# Patient Record
Sex: Female | Born: 1955
Health system: Southern US, Community
[De-identification: ages and names within clinical notes are randomized; demographics above are authoritative.]

## PROBLEM LIST (undated history)

## (undated) DIAGNOSIS — M1990 Unspecified osteoarthritis, unspecified site: Secondary | ICD-10-CM

## (undated) DIAGNOSIS — M24411 Recurrent dislocation, right shoulder: Secondary | ICD-10-CM

## (undated) DIAGNOSIS — M549 Dorsalgia, unspecified: Secondary | ICD-10-CM

## (undated) DIAGNOSIS — G8929 Other chronic pain: Secondary | ICD-10-CM

## (undated) DIAGNOSIS — J449 Chronic obstructive pulmonary disease, unspecified: Secondary | ICD-10-CM

## (undated) DIAGNOSIS — I10 Essential (primary) hypertension: Secondary | ICD-10-CM

## (undated) HISTORY — PX: FRACTURE SURGERY: SHX138

## (undated) HISTORY — PX: TUBAL LIGATION: SHX77

---

## 2002-02-14 ENCOUNTER — Emergency Department (HOSPITAL_COMMUNITY): Admission: EM | Admit: 2002-02-14 | Discharge: 2002-02-14 | Payer: Self-pay | Admitting: Emergency Medicine

## 2012-09-19 DIAGNOSIS — G250 Essential tremor: Secondary | ICD-10-CM | POA: Insufficient documentation

## 2017-04-12 ENCOUNTER — Encounter (HOSPITAL_COMMUNITY): Payer: Self-pay | Admitting: Emergency Medicine

## 2017-04-12 ENCOUNTER — Emergency Department (HOSPITAL_COMMUNITY): Payer: Medicare Other

## 2017-04-12 ENCOUNTER — Observation Stay (HOSPITAL_COMMUNITY)
Admission: EM | Admit: 2017-04-12 | Discharge: 2017-04-13 | Disposition: A | Discharge status: Home / Self Care | Payer: Medicare Other | Attending: Family Medicine | Admitting: Family Medicine

## 2017-04-12 ENCOUNTER — Observation Stay (HOSPITAL_COMMUNITY): Payer: Medicare Other

## 2017-04-12 DIAGNOSIS — F17210 Nicotine dependence, cigarettes, uncomplicated: Secondary | ICD-10-CM | POA: Insufficient documentation

## 2017-04-12 DIAGNOSIS — R945 Abnormal results of liver function studies: Secondary | ICD-10-CM

## 2017-04-12 DIAGNOSIS — A419 Sepsis, unspecified organism: Secondary | ICD-10-CM | POA: Diagnosis present

## 2017-04-12 DIAGNOSIS — R7989 Other specified abnormal findings of blood chemistry: Secondary | ICD-10-CM | POA: Diagnosis present

## 2017-04-12 DIAGNOSIS — J441 Chronic obstructive pulmonary disease with (acute) exacerbation: Secondary | ICD-10-CM | POA: Diagnosis not present

## 2017-04-12 DIAGNOSIS — W06XXXD Fall from bed, subsequent encounter: Secondary | ICD-10-CM | POA: Insufficient documentation

## 2017-04-12 DIAGNOSIS — J9601 Acute respiratory failure with hypoxia: Secondary | ICD-10-CM | POA: Insufficient documentation

## 2017-04-12 DIAGNOSIS — R4182 Altered mental status, unspecified: Secondary | ICD-10-CM | POA: Diagnosis present

## 2017-04-12 DIAGNOSIS — I700 Atherosclerosis of aorta: Secondary | ICD-10-CM

## 2017-04-12 DIAGNOSIS — S43004D Unspecified dislocation of right shoulder joint, subsequent encounter: Secondary | ICD-10-CM

## 2017-04-12 DIAGNOSIS — I10 Essential (primary) hypertension: Secondary | ICD-10-CM | POA: Diagnosis present

## 2017-04-12 DIAGNOSIS — Z23 Encounter for immunization: Secondary | ICD-10-CM | POA: Diagnosis not present

## 2017-04-12 DIAGNOSIS — Z5181 Encounter for therapeutic drug level monitoring: Secondary | ICD-10-CM | POA: Insufficient documentation

## 2017-04-12 DIAGNOSIS — S43015D Anterior dislocation of left humerus, subsequent encounter: Principal | ICD-10-CM | POA: Insufficient documentation

## 2017-04-12 DIAGNOSIS — J9621 Acute and chronic respiratory failure with hypoxia: Secondary | ICD-10-CM | POA: Diagnosis not present

## 2017-04-12 DIAGNOSIS — R739 Hyperglycemia, unspecified: Secondary | ICD-10-CM | POA: Diagnosis present

## 2017-04-12 DIAGNOSIS — M24411 Recurrent dislocation, right shoulder: Secondary | ICD-10-CM | POA: Diagnosis present

## 2017-04-12 DIAGNOSIS — E66813 Obesity, class 3: Secondary | ICD-10-CM

## 2017-04-12 HISTORY — DX: Unspecified osteoarthritis, unspecified site: M19.90

## 2017-04-12 HISTORY — DX: Recurrent dislocation, right shoulder: M24.411

## 2017-04-12 HISTORY — DX: Chronic obstructive pulmonary disease, unspecified: J44.9

## 2017-04-12 HISTORY — DX: Other chronic pain: G89.29

## 2017-04-12 HISTORY — DX: Essential (primary) hypertension: I10

## 2017-04-12 HISTORY — DX: Dorsalgia, unspecified: M54.9

## 2017-04-12 LAB — COMPREHENSIVE METABOLIC PANEL
ALT: 73 U/L — ABNORMAL HIGH (ref 14–54)
ANION GAP: 7 (ref 5–15)
AST: 252 U/L — ABNORMAL HIGH (ref 15–41)
Albumin: 3.2 g/dL — ABNORMAL LOW (ref 3.5–5.0)
Alkaline Phosphatase: 27 U/L — ABNORMAL LOW (ref 38–126)
BUN: 22 mg/dL — ABNORMAL HIGH (ref 6–20)
CHLORIDE: 96 mmol/L — AB (ref 101–111)
CO2: 30 mmol/L (ref 22–32)
Calcium: 8.9 mg/dL (ref 8.9–10.3)
Creatinine, Ser: 1.33 mg/dL — ABNORMAL HIGH (ref 0.44–1.00)
GFR, EST AFRICAN AMERICAN: 50 mL/min — AB (ref >60)
GFR, EST NON AFRICAN AMERICAN: 43 mL/min — AB (ref >60)
Glucose, Bld: 123 mg/dL — ABNORMAL HIGH (ref 65–99)
POTASSIUM: 3.7 mmol/L (ref 3.5–5.1)
SODIUM: 133 mmol/L — AB (ref 135–145)
Total Bilirubin: 0.8 mg/dL (ref 0.3–1.2)
Total Protein: 6.8 g/dL (ref 6.5–8.1)

## 2017-04-12 LAB — CBC WITH DIFFERENTIAL/PLATELET
Basophils Absolute: 0 10*3/uL (ref 0.0–0.1)
Basophils Relative: 0 %
EOS PCT: 0 %
Eosinophils Absolute: 0 10*3/uL (ref 0.0–0.7)
HCT: 36.5 % (ref 36.0–46.0)
HEMOGLOBIN: 12 g/dL (ref 12.0–15.0)
LYMPHS ABS: 0.8 10*3/uL (ref 0.7–4.0)
LYMPHS PCT: 7 %
MCH: 30.4 pg (ref 26.0–34.0)
MCHC: 32.9 g/dL (ref 30.0–36.0)
MCV: 92.4 fL (ref 78.0–100.0)
MONOS PCT: 6 %
Monocytes Absolute: 0.7 10*3/uL (ref 0.1–1.0)
NEUTROS PCT: 87 %
Neutro Abs: 9.7 10*3/uL — ABNORMAL HIGH (ref 1.7–7.7)
Platelets: 194 10*3/uL (ref 150–400)
RBC: 3.95 MIL/uL (ref 3.87–5.11)
RDW: 14.4 % (ref 11.5–15.5)
WBC: 11.1 10*3/uL — AB (ref 4.0–10.5)

## 2017-04-12 LAB — LACTIC ACID, PLASMA
LACTIC ACID, VENOUS: 1 mmol/L (ref 0.5–1.9)
Lactic Acid, Venous: 0.8 mmol/L (ref 0.5–1.9)

## 2017-04-12 LAB — CBG MONITORING, ED: Glucose-Capillary: 129 mg/dL — ABNORMAL HIGH (ref 65–99)

## 2017-04-12 LAB — PROCALCITONIN: PROCALCITONIN: 1.4 ng/mL

## 2017-04-12 LAB — PROTIME-INR
INR: 1.09
Prothrombin Time: 14.1 seconds (ref 11.4–15.2)

## 2017-04-12 LAB — APTT: APTT: 45 s — AB (ref 24–36)

## 2017-04-12 MED ORDER — SODIUM CHLORIDE 0.9 % IV BOLUS (SEPSIS)
1000.0000 mL | Freq: Once | INTRAVENOUS | Status: AC
  Administered 2017-04-12: 1000 mL via INTRAVENOUS

## 2017-04-12 MED ORDER — PNEUMOCOCCAL VAC POLYVALENT 25 MCG/0.5ML IJ INJ
0.5000 mL | INJECTION | INTRAMUSCULAR | Status: AC
  Administered 2017-04-13: 0.5 mL via INTRAMUSCULAR
  Filled 2017-04-12: qty 0.5

## 2017-04-12 MED ORDER — ALBUTEROL SULFATE (2.5 MG/3ML) 0.083% IN NEBU
2.5000 mg | INHALATION_SOLUTION | Freq: Once | RESPIRATORY_TRACT | Status: AC
  Administered 2017-04-12: 2.5 mg via RESPIRATORY_TRACT
  Filled 2017-04-12: qty 3

## 2017-04-12 MED ORDER — SODIUM CHLORIDE 0.9 % IV SOLN
INTRAVENOUS | Status: DC
  Administered 2017-04-12 – 2017-04-13 (×2): via INTRAVENOUS

## 2017-04-12 MED ORDER — DEXTROSE 5 % IV SOLN
1.0000 g | Freq: Once | INTRAVENOUS | Status: AC
  Administered 2017-04-12: 1 g via INTRAVENOUS
  Filled 2017-04-12: qty 10

## 2017-04-12 MED ORDER — IPRATROPIUM-ALBUTEROL 0.5-2.5 (3) MG/3ML IN SOLN
3.0000 mL | Freq: Four times a day (QID) | RESPIRATORY_TRACT | Status: DC
  Administered 2017-04-12: 3 mL via RESPIRATORY_TRACT
  Filled 2017-04-12: qty 3

## 2017-04-12 MED ORDER — ACETAMINOPHEN 500 MG PO TABS
1000.0000 mg | ORAL_TABLET | Freq: Once | ORAL | Status: AC
  Administered 2017-04-12: 1000 mg via ORAL
  Filled 2017-04-12: qty 2

## 2017-04-12 MED ORDER — METHYLPREDNISOLONE SODIUM SUCC 125 MG IJ SOLR
60.0000 mg | Freq: Two times a day (BID) | INTRAMUSCULAR | Status: DC
  Administered 2017-04-12 – 2017-04-13 (×3): 60 mg via INTRAVENOUS
  Filled 2017-04-12 (×3): qty 2

## 2017-04-12 MED ORDER — IPRATROPIUM-ALBUTEROL 0.5-2.5 (3) MG/3ML IN SOLN
3.0000 mL | Freq: Once | RESPIRATORY_TRACT | Status: AC
  Administered 2017-04-12: 3 mL via RESPIRATORY_TRACT
  Filled 2017-04-12: qty 3

## 2017-04-12 MED ORDER — IOPAMIDOL (ISOVUE-370) INJECTION 76%
100.0000 mL | Freq: Once | INTRAVENOUS | Status: AC | PRN
  Administered 2017-04-12: 100 mL via INTRAVENOUS

## 2017-04-12 MED ORDER — DEXTROSE 5 % IV SOLN
1.0000 g | INTRAVENOUS | Status: DC
  Administered 2017-04-13: 1 g via INTRAVENOUS
  Filled 2017-04-12 (×2): qty 10

## 2017-04-12 MED ORDER — ALBUTEROL SULFATE (2.5 MG/3ML) 0.083% IN NEBU: 5.0000 mg | INHALATION_SOLUTION | Freq: Once | RESPIRATORY_TRACT | Status: DC

## 2017-04-12 MED ORDER — DEXTROSE 5 % IV SOLN
500.0000 mg | INTRAVENOUS | Status: DC
  Administered 2017-04-13: 500 mg via INTRAVENOUS
  Filled 2017-04-12 (×2): qty 500

## 2017-04-12 MED ORDER — IPRATROPIUM-ALBUTEROL 0.5-2.5 (3) MG/3ML IN SOLN
3.0000 mL | Freq: Three times a day (TID) | RESPIRATORY_TRACT | Status: DC
  Administered 2017-04-13 (×2): 3 mL via RESPIRATORY_TRACT
  Filled 2017-04-12 (×2): qty 3

## 2017-04-12 MED ORDER — AZITHROMYCIN 500 MG IV SOLR
500.0000 mg | Freq: Once | INTRAVENOUS | Status: AC
  Administered 2017-04-12: 500 mg via INTRAVENOUS
  Filled 2017-04-12: qty 500

## 2017-04-12 MED ORDER — IPRATROPIUM BROMIDE 0.02 % IN SOLN: 0.5000 mg | Freq: Once | RESPIRATORY_TRACT | Status: DC

## 2017-04-12 MED ORDER — ALBUTEROL SULFATE (2.5 MG/3ML) 0.083% IN NEBU: 2.5000 mg | INHALATION_SOLUTION | RESPIRATORY_TRACT | Status: DC | PRN

## 2017-04-12 MED ORDER — ENOXAPARIN SODIUM 40 MG/0.4ML ~~LOC~~ SOLN
40.0000 mg | SUBCUTANEOUS | Status: DC
  Administered 2017-04-12: 40 mg via SUBCUTANEOUS
  Filled 2017-04-12: qty 0.4

## 2017-04-12 MED ORDER — METHYLPREDNISOLONE SODIUM SUCC 125 MG IJ SOLR
125.0000 mg | Freq: Once | INTRAMUSCULAR | Status: AC
  Administered 2017-04-12: 125 mg via INTRAVENOUS
  Filled 2017-04-12: qty 2

## 2017-04-12 MED ORDER — ENSURE ENLIVE PO LIQD: 237.0000 mL | Freq: Two times a day (BID) | ORAL | Status: DC

## 2017-04-12 MED ORDER — ACETAMINOPHEN-CODEINE #3 300-30 MG PO TABS: 1.0000 | ORAL_TABLET | Freq: Four times a day (QID) | ORAL | Status: DC | PRN

## 2017-04-12 NOTE — ED Notes (Signed)
RA sats 86%, O2 applied via [place] at 2 L/M

## 2017-04-12 NOTE — ED Notes (Signed)
Paged Dr. Ophelia CharterYates regarding level of care for admission. Dr. Ophelia CharterYates reported symptoms lung related not cardiac and did not need telemetry. Primary RN and Receiving RN aware.

## 2017-04-12 NOTE — ED Triage Notes (Addendum)
Per EMS, pt fell out of bed x2 days. Pt family reports pt began "not acting right" since yesterday. cbg en route 151. Pt alert, airway patent. Generalized trembling noted, per EMS this is baseline. nad noted. Pt alert and oriented.  Pt reports was scheduled to have right shoulder rotator cuff repair on 04/13/17 at [facility].

## 2017-04-12 NOTE — ED Notes (Signed)
Patient ambulatory to the restroom.  Unable to obtain urine specimen as stool was mixed in also.  Patient became very short of breath and unable to walk back to room. o2 sat had dropped to 82%

## 2017-04-12 NOTE — ED Provider Notes (Signed)
[facility]Y DEPT Provider Note   CSN: [id number] Arrival date & time: 04/12/17  1203     History   Chief Complaint Chief Complaint  Patient presents with  . Altered Mental Status    HPI Sydney Cruz is a 61 y.o. female.  The patient presents for evaluation of altered mental status, which started 2 days ago and was associated with a fall from bed yesterday.  Family members called EMS because "she is not acting right."    Remainder of history, obtained from patient, at 14: 40.  She relates falling from bed, 2 days ago, because she was sitting near the edge, and slipped off.  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  She is following at [facility] for orthopedic management with possible surgery in the future.  Apparently she has had attempts at relocation, but failed, because of "a torn rotator cuff."  The patient states that she is here today because she "did not feel well."  She has been getting help at home from her son, and other people who help her get dressed because she is unable to use her right arm.  She continues to smoke cigarettes.  She denies fever, chills, vomiting, focal weakness or paresthesia.  She is taking her usual medicines including an albuterol inhaler, but did not use the inhaler today.  There are no other known modifying factors.  HPI  Past Medical History:  Diagnosis Date  . Hypertension     There are no active problems to display for this patient.   Past Surgical History:  Procedure Laterality Date  . TUBAL LIGATION      OB History    No data available       Home Medications    Prior to Admission medications   Medication Sig Start Date End Date Taking? Authorizing Provider  acetaminophen-codeine (TYLENOL #3) 300-30 MG tablet TAKE 1 TABLET BY MOUTH ONCE DAILY AS NEEDED FOR PAIN 03/31/17  Yes [provider]  albuterol (PROVENTIL HFA;VENTOLIN HFA) 108 (90 Base)  MCG/ACT inhaler Inhale into the lungs. 05/11/16  Yes [provider]  amLODipine (NORVASC) 10 MG tablet Take 10 mg by mouth daily.  02/25/16  Yes [provider]  lisinopril-hydrochlorothiazide (PRINZIDE,ZESTORETIC) 20-25 MG tablet Take 1 tablet by mouth daily.  03/28/17  Yes [provider]  naproxen (NAPROSYN) 500 MG tablet Take 500 mg by mouth. 01/12/17  Yes [provider]    Family History History reviewed. No pertinent family history.  Social History Social History  Substance Use Topics  . Smoking status: Current Every Day Smoker    Packs/day: 0.50    Types: Cigarettes  . Smokeless tobacco: Never Used  . Alcohol use Yes     Comment: occ     Allergies   Patient has no allergy information on record.   Review of Systems Review of Systems  All other systems reviewed and are negative.    Physical Exam Updated Vital Signs BP (!) 106/47   Pulse 89   Temp 100 F (37.8 C) (Oral)   Resp (!) 27   Ht 5\' 6"  (1.676 m)   Wt 123.4 kg (272 lb)   SpO2 94%   BMI 43.90 kg/m   Physical Exam  Constitutional: She appears well-developed.  Obese, appears older than stated age, appears ill.  HENT:  Head: Normocephalic and atraumatic.  Eyes: Conjunctivae and EOM are normal. Pupils are equal,  round, and reactive to light.  Neck: Normal range of motion and phonation normal. Neck supple.  Cardiovascular: Normal rate and regular rhythm.   Pulmonary/Chest: No respiratory distress. She exhibits no tenderness.  She is tachypneic.  Decreased air movement bilaterally anteriorly and posteriorly with generalized rhonchi.  Few audible wheezes.  There is no increased work of breathing.  Abdominal: Soft. She exhibits no distension. There is no tenderness. There is no guarding.  Musculoskeletal: Normal range of motion. She exhibits edema (1-2+ bilateral lower legs).  Neurological: She is alert. No cranial nerve deficit. She exhibits normal muscle tone.  Oriented to  person and place  Skin: Skin is warm and dry.  Psychiatric:  Lethargic  Nursing note and vitals reviewed.    ED Treatments / Results  Labs (all labs ordered are listed, but only abnormal results are displayed) Labs Reviewed  CBC WITH DIFFERENTIAL/PLATELET - Abnormal; Notable for the following:       Result Value   WBC 11.1 (*)    Neutro Abs 9.7 (*)    All other components within normal limits  COMPREHENSIVE METABOLIC PANEL - Abnormal; Notable for the following:    Sodium 133 (*)    Chloride 96 (*)    Glucose, Bld 123 (*)    BUN 22 (*)    Creatinine, Ser 1.33 (*)    Albumin 3.2 (*)    AST 252 (*)    ALT 73 (*)    Alkaline Phosphatase 27 (*)    GFR calc non Af Amer 43 (*)    GFR calc Af Amer 50 (*)    All other components within normal limits  CBG MONITORING, ED - Abnormal; Notable for the following:    Glucose-Capillary 129 (*)    All other components within normal limits  CULTURE, BLOOD (ROUTINE X 2)  CULTURE, BLOOD (ROUTINE X 2)  URINE CULTURE  LACTIC ACID, PLASMA  LACTIC ACID, PLASMA  URINALYSIS, ROUTINE W REFLEX MICROSCOPIC    EKG  EKG Interpretation None       Radiology Dg Chest Portable 1 View  Result Date: 04/12/2017 CLINICAL DATA:  S post fall out of bed 2 days ago. Hypoxia. Current smoker. EXAM: PORTABLE CHEST 1 VIEW COMPARISON:  None in PACs FINDINGS: The increased overlying soft tissues results inscatter effects limiting the technical quality of the radiograph. The lungs are adequately inflated. There is no focal infiltrate. The heart and pulmonary vascularity are normal. The mediastinum is normal in width. There is faint calcification in the wall of the aortic arch. The observed bony thorax reveals abnormal appearance of the left shoulder worrisome for acute dislocation with avulsion of the greater tuberosity. IMPRESSION: Limited study due to overlying soft tissues. No definite acute cardiopulmonary abnormality. Thoracic aortic atherosclerosis. Abnormal  appearance of the right humeral head consistent with dislocation inferiorly as well as avulsion of the greater tuberosity. A right shoulder series is recommended. Electronically Signed   By: David  [place] M.D.   On: 04/12/2017 12:42    Procedures Procedures (including critical care time)  Medications Ordered in ED Medications  sodium chloride 0.9 % bolus 1,000 mL (0 mLs Intravenous Stopped 04/12/17 1347)    And  sodium chloride 0.9 % bolus 1,000 mL (0 mLs Intravenous Stopped 04/12/17 1439)    And  sodium chloride 0.9 % bolus 1,000 mL (0 mLs Intravenous Stopped 04/12/17 1502)    And  sodium chloride 0.9 % bolus 1,000 mL (1,000 mLs Intravenous New Bag/Given 04/12/17 1503)  cefTRIAXone (ROCEPHIN) 1  g in dextrose 5 % 50 mL IVPB (0 g Intravenous Stopped 04/12/17 1318)  azithromycin (ZITHROMAX) 500 mg in dextrose 5 % 250 mL IVPB (0 mg Intravenous Stopped 04/12/17 1438)  acetaminophen (TYLENOL) tablet 1,000 mg (1,000 mg Oral Given 04/12/17 1242)  methylPREDNISolone sodium succinate (SOLU-MEDROL) 125 mg/2 mL injection 125 mg (125 mg Intravenous Given 04/12/17 1447)  ipratropium-albuterol (DUONEB) 0.5-2.5 (3) MG/3ML nebulizer solution 3 mL (3 mLs Nebulization Given 04/12/17 1455)  albuterol (PROVENTIL) (2.5 MG/3ML) 0.083% nebulizer solution 2.5 mg (2.5 mg Nebulization Given 04/12/17 1455)     Initial Impression / Assessment and Plan / ED Course  I have reviewed the triage vital signs and the nursing notes.  Pertinent labs & imaging results that were available during my care of the patient were reviewed by me and considered in my medical decision making (see chart for details).  Clinical Course as of Apr 12 1604  Wed Apr 12, 2017  1230 Initial impression, is consistent with respiratory infection, possible sepsis.  SIRS positive.  [EW]    Clinical Course User Index [EW] Mancel BaleWentz, Val Schiavo, MD     Patient Vitals for the past 24 hrs:  BP Temp Temp src Pulse Resp SpO2 Height Weight  04/12/17 1545 - - - 89 (!) 27  94 % - -  04/12/17 1531 - - - 88 (!) 29 94 % - -  04/12/17 1530 - - - 86 (!) 31 96 % - -  04/12/17 1515 - - - 88 (!) 38 95 % - -  04/12/17 1455 - - - - - 95 % - -  04/12/17 1446 - - - 89 (!) 24 94 % - -  04/12/17 1431 (!) 106/47 - - - (!) 22 - - -  04/12/17 1400 (!) 117/55 - - 93 (!) 28 99 % - -  04/12/17 1345 - - - 95 (!) 33 99 % - -  04/12/17 1343 - 100 F (37.8 C) Oral - - - - -  04/12/17 1330 125/62 - - 96 (!) 36 97 % - -  04/12/17 1315 - - - 99 (!) 33 93 % - -  04/12/17 1300 137/69 - - 100 (!) 38 93 % - -  04/12/17 1245 - - - 100 (!) 31 95 % - -  04/12/17 1222 - - - (!) 105 (!) 33 100 % - -  04/12/17 1216 - - - - - - 5\' 6"  (1.676 m) 123.4 kg (272 lb)  04/12/17 1215 (!) 114/49 (!) 101.4 F (38.6 C) Oral (!) 111 (!) 23 97 % - -  04/12/17 1212 (!) 114/49 - - (!) 111 (!) 29 96 % - -    4:05 PM Reevaluation with update and discussion. After initial assessment and treatment, an updated evaluation reveals clinical status is unchanged.  Patient's son is here now and findings were discussed with him.  He affirms that the patient has a chronic right shoulder dislocation, and states that she is due to have it surgically repaired tomorrow.  He reports that her shortness of breath started yesterday, and was associated with her being somewhat "out of it."  He thinks that she is using her inhaler at home.  He cannot recall any other changes in her clinical status.  Findings discussed with son, and all questions were answered. Jamir Rone L   Case discussed with hospitalist, to arrange admission.  CRITICAL CARE Performed by: Flint MelterWENTZ,Ara Grandmaison L Total critical care time: 40 minutes Critical care time was exclusive of separately  billable procedures and treating other patients. Critical care was necessary to treat or prevent imminent or life-threatening deterioration. Critical care was time spent personally by me on the following activities: development of treatment plan with patient and/or surrogate  as well as nursing, discussions with consultants, evaluation of patient's response to treatment, examination of patient, obtaining history from patient or surrogate, ordering and performing treatments and interventions, ordering and review of laboratory studies, ordering and review of radiographic studies, pulse oximetry and re-evaluation of patient's condition.  Final Clinical Impressions(s) / ED Diagnoses   Final diagnoses:  Acute hypoxemic respiratory failure (HCC)  COPD exacerbation (HCC)  Shoulder dislocation, right, subsequent encounter   COPD exacerbation, with fever and hypoxia.  Lactate normal.  No evidence for pneumonia on initial imaging.  Patient has been covered for community acquired pneumonia with perenteral antibiotics.  She received sepsis dose IV fluid bolus but does not have findings consistent with sepsis.  She will require admission for stabilization.  Nursing Notes Reviewed/ Care Coordinated Applicable Imaging Reviewed Interpretation of Laboratory Data incorporated into ED treatment  Plan: Admit   New Prescriptions New Prescriptions   No medications on file     Mancel Bale, MD 04/13/17 [phone]

## 2017-04-12 NOTE — H&P (Signed)
History and Physical    Sydney Cruz UEA:[id number] DOB: [birth year] DOA: 04/12/2017  PCP: Josephine Igo in [place]; planning to find a PCP in [place] Consultants:  Prentiss Bells - [facility] orthopedics Patient coming from: Moved back to [place] on Saturday to be closer to her mother and 9 siblings; Lives with son; Jackey Loge: daughter, [phone]  Chief Complaint: AMS  HPI: Sydney Cruz is a 61 y.o. female with medical history significant of HTN, COPD not on home O2, and chronic right shoulder dislocation with planned surgical correction at [facility] tomorrow presenting with AMS.  She reports that she "ain't been feeling too good for about a day or day and a half".  She felt ok in the morning yesterday but started breathing real hard when they went to [facility].  She complained of fatigue yesterday, weakness.  +SOB, increased WOB.  Occasional wheezing.  +fever in the ER to 103.  No change in cough, nonproductive.  No sick contacts.   ED Course: COPD exacerbation, fever, hypoxia.  Covered for CAP with enteral abx despite negative CXR.  Gave sepsis but low concern for sepsis.  Review of Systems: As per HPI; otherwise review of systems reviewed and negative.   Ambulatory Status:  Ambulates without assistance  Past Medical History:  Diagnosis Date  . Arthritis   . Chronic back pain   . Chronic dislocation of right shoulder    for upcoming operative repair  . COPD (chronic obstructive pulmonary disease) (HCC)    has not been formally told she has this  . Hypertension     Past Surgical History:  Procedure Laterality Date  . TUBAL LIGATION      Social History   Social History  . Marital status: Single    Spouse name: N/A  . Number of children: N/A  . Years of education: N/A   Occupational History  . disabled    Social History Main Topics  . Smoking status: Current Every Day Smoker    Packs/day: 0.25    Years: 25.00    Types: Cigarettes  . Smokeless tobacco: Never Used  . Alcohol use  Yes     Comment: occasional  . Drug use: No  . Sexual activity: No   Other Topics Concern  . Not on file   Social History Narrative  . No narrative on file    No Known Allergies  History reviewed. No pertinent family history.  Prior to Admission medications   Medication Sig Start Date End Date Taking? Authorizing Provider  acetaminophen-codeine (TYLENOL #3) 300-30 MG tablet TAKE 1 TABLET BY MOUTH ONCE DAILY AS NEEDED FOR PAIN 03/31/17  Yes [provider]  albuterol (PROVENTIL HFA;VENTOLIN HFA) 108 (90 Base) MCG/ACT inhaler Inhale into the lungs. 05/11/16  Yes [provider]  amLODipine (NORVASC) 10 MG tablet Take 10 mg by mouth daily.  02/25/16  Yes [provider]  lisinopril-hydrochlorothiazide (PRINZIDE,ZESTORETIC) 20-25 MG tablet Take 1 tablet by mouth daily.  03/28/17  Yes [provider]  naproxen (NAPROSYN) 500 MG tablet Take 500 mg by mouth. 01/12/17  Yes [provider]    Physical Exam: Vitals:   04/12/17 1743 04/12/17 1745 04/12/17 1834 04/12/17 2130  BP: 108/88  (!) 124/109   Pulse: 85  (!) 107   Resp: (!) 35  (!) 34   Temp:  99.2 F (37.3 C) 98.8 F (37.1 C)   TempSrc:  Oral Oral   SpO2: 94%  96% 93%  Weight:   126.7 kg (279 lb 5.2 oz)  Height:   5\' 6"  (1.676 m)      General: Appears calm and comfortable and is NAD, on [place] O2 Eyes:  PERRL, EOMI, normal lids, iris ENT:  grossly normal hearing, lips & tongue, mmm Neck:  no LAD, masses or thyromegaly Cardiovascular:  RRR, no m/r/g. No LE edema.  Respiratory:  Mild diffuse wheezing, no w/r/r. Normal respiratory effort. Abdomen:  soft, ntnd, NABS Skin:  no rash or induration seen on limited exam Musculoskeletal:  grossly normal tone BUE/BLE, good ROM, no bony abnormality Psychiatric:  grossly normal mood and affect, speech fluent and appropriate, AOx3 Neurologic:  CN 2-12 grossly intact, moves all extremities in coordinated fashion, sensation intact  Labs on  Admission: I have personally reviewed following labs and imaging studies  CBC:  Recent Labs Lab 04/12/17 1218  WBC 11.1*  NEUTROABS 9.7*  HGB 12.0  HCT 36.5  MCV 92.4  PLT 194   Basic Metabolic Panel:  Recent Labs Lab 04/12/17 1237  NA 133*  K 3.7  CL 96*  CO2 30  GLUCOSE 123*  BUN 22*  CREATININE 1.33*  CALCIUM 8.9   GFR: Estimated Creatinine Clearance: 62 mL/min (A) (by C-G formula based on SCr of 1.33 mg/dL (H)). Liver Function Tests:  Recent Labs Lab 04/12/17 1237  AST 252*  ALT 73*  ALKPHOS 27*  BILITOT 0.8  PROT 6.8  ALBUMIN 3.2*   No results for input(s): LIPASE, AMYLASE in the last 168 hours. No results for input(s): AMMONIA in the last 168 hours. Coagulation Profile:  Recent Labs Lab 04/12/17 1915  INR 1.09   Cardiac Enzymes: No results for input(s): CKTOTAL, CKMB, CKMBINDEX, TROPONINI in the last 168 hours. BNP (last 3 results) No results for input(s): PROBNP in the last 8760 hours. HbA1C: No results for input(s): HGBA1C in the last 72 hours. CBG:  Recent Labs Lab 04/12/17 1214  GLUCAP 129*   Lipid Profile: No results for input(s): CHOL, HDL, LDLCALC, TRIG, CHOLHDL, LDLDIRECT in the last 72 hours. Thyroid Function Tests: No results for input(s): TSH, T4TOTAL, FREET4, T3FREE, THYROIDAB in the last 72 hours. Anemia Panel: No results for input(s): VITAMINB12, FOLATE, FERRITIN, TIBC, IRON, RETICCTPCT in the last 72 hours. Urine analysis: No results found for: COLORURINE, APPEARANCEUR, LABSPEC, PHURINE, GLUCOSEU, HGBUR, BILIRUBINUR, KETONESUR, PROTEINUR, UROBILINOGEN, NITRITE, LEUKOCYTESUR  Creatinine Clearance: Estimated Creatinine Clearance: 62 mL/min (A) (by C-G formula based on SCr of 1.33 mg/dL (H)).  Sepsis Labs: @LABRCNTIP (procalcitonin:4,lacticidven:4) ) Recent Results (from the past 240 hour(s))  Blood culture (routine x 2)     Status: None (Preliminary result)   Collection Time: 04/12/17 12:37 PM  Result Value Ref Range  Status   Specimen Description LEFT ANTECUBITAL  Final   Special Requests   Final    BOTTLES DRAWN AEROBIC AND ANAEROBIC Blood Culture adequate volume   Culture PENDING  Incomplete   Report Status PENDING  Incomplete  Blood culture (routine x 2)     Status: None (Preliminary result)   Collection Time: 04/12/17 12:45 PM  Result Value Ref Range Status   Specimen Description RIGHT ANTECUBITAL  Final   Special Requests   Final    BOTTLES DRAWN AEROBIC AND ANAEROBIC Blood Culture adequate volume   Culture PENDING  Incomplete   Report Status PENDING  Incomplete     Radiological Exams on Admission: Dg Chest Portable 1 View  Result Date: 04/12/2017 CLINICAL DATA:  S post fall out of bed 2 days ago. Hypoxia. Current smoker. EXAM: PORTABLE CHEST 1 VIEW COMPARISON:  None in PACs FINDINGS: The increased overlying soft tissues results inscatter effects limiting the technical quality of the radiograph. The lungs are adequately inflated. There is no focal infiltrate. The heart and pulmonary vascularity are normal. The mediastinum is normal in width. There is faint calcification in the wall of the aortic arch. The observed bony thorax reveals abnormal appearance of the left shoulder worrisome for acute dislocation with avulsion of the greater tuberosity. IMPRESSION: Limited study due to overlying soft tissues. No definite acute cardiopulmonary abnormality. Thoracic aortic atherosclerosis. Abnormal appearance of the right humeral head consistent with dislocation inferiorly as well as avulsion of the greater tuberosity. A right shoulder series is recommended. Electronically Signed   By: David  [place]azil[place] M.D.   On: 04/12/2017 12:42    EKG: Not done  Assessment/Plan Principal Problem:   Acute on chronic respiratory failure with hypoxia (HCC) Active Problems:   Elevated LFTs   Hyperglycemia   Sepsis (HCC)   Essential hypertension   Chronic dislocation of right shoulder   Acute on chronic respiratory failure  with hypoxia/sepsis -Patient presenting with hypoxic respiratory failure -Known h/o COPD but denies significant change in cough, sputum -COPD exacerbation is a possibility but this is not an obvious diagnosis -CXR was not overly helpful -Will order CTA -Elevated WBC count (11.1), fever, tachycardia, tachypnea with normal lactate and borderline hypotension with elevated procalcitonin (1.40) -While awaiting blood cultures, this appears to be a preseptic condition. -Sepsis protocol initiated -Blood and urine cultures pending -Will place in observation status with telemetry and continue to monitor -Treat with IV Rocephin and Azithromycin -Will add HIV -Will trend procalcitonin to ensure improvement  Hyperglycemia -Glucose 123 -May be stress response -Will follow with fasting AM labs  Elevated LFTs -AST 252/ALT 73 - normal in 3/16 -Uncertain etiology -Denies abdominal pain -She does acknowledge h/o ETOH use -Will recheck in AM and consider imaging  HTN -Takes Norvasc and Zestoretic -Hold BP medications due to borderline low BP  Chronic shoulder dislocation -Inferior dislocation and fracture of the greater tuberosity -Patient was scheduled for outpatient [facility] at [facility] tomorrow but this will need to be rescheduled  DVT prophylaxis: Lovenox Code Status: Full - confirmed with patient/family Family Communication: Son present throughout evaluation Disposition Plan:  Home once clinically improved Consults called: None  Admission status: It is my clinical opinion that referral for OBSERVATION is reasonable and necessary in this patient based on the above information provided. The aforementioned taken together are felt to place the patient at high risk for further clinical deterioration. However it is anticipated that the patient may be medically stable for discharge from the hospital within 24 to 48 hours.    Jonah BlueJennifer Barett Whidbee MD [facility]  If 7PM-7AM, please contact  night-coverage [email or web address] Password [email or web address]  04/12/2017, 9:53 PM

## 2017-04-13 DIAGNOSIS — A419 Sepsis, unspecified organism: Secondary | ICD-10-CM | POA: Diagnosis not present

## 2017-04-13 DIAGNOSIS — J13 Pneumonia due to Streptococcus pneumoniae: Secondary | ICD-10-CM

## 2017-04-13 DIAGNOSIS — J9620 Acute and chronic respiratory failure, unspecified whether with hypoxia or hypercapnia: Secondary | ICD-10-CM

## 2017-04-13 DIAGNOSIS — I700 Atherosclerosis of aorta: Secondary | ICD-10-CM | POA: Diagnosis not present

## 2017-04-13 DIAGNOSIS — J181 Lobar pneumonia, unspecified organism: Secondary | ICD-10-CM

## 2017-04-13 DIAGNOSIS — R0902 Hypoxemia: Secondary | ICD-10-CM

## 2017-04-13 DIAGNOSIS — R7989 Other specified abnormal findings of blood chemistry: Secondary | ICD-10-CM | POA: Diagnosis not present

## 2017-04-13 DIAGNOSIS — J9621 Acute and chronic respiratory failure with hypoxia: Secondary | ICD-10-CM | POA: Diagnosis not present

## 2017-04-13 DIAGNOSIS — S43015D Anterior dislocation of left humerus, subsequent encounter: Secondary | ICD-10-CM | POA: Diagnosis not present

## 2017-04-13 LAB — CBC WITH DIFFERENTIAL/PLATELET
BASOS PCT: 0 %
Basophils Absolute: 0 10*3/uL (ref 0.0–0.1)
EOS ABS: 0 10*3/uL (ref 0.0–0.7)
Eosinophils Relative: 0 %
HCT: 34.4 % — ABNORMAL LOW (ref 36.0–46.0)
HEMOGLOBIN: 11.2 g/dL — AB (ref 12.0–15.0)
Lymphocytes Relative: 4 %
Lymphs Abs: 0.4 10*3/uL — ABNORMAL LOW (ref 0.7–4.0)
MCH: 30 pg (ref 26.0–34.0)
MCHC: 32.6 g/dL (ref 30.0–36.0)
MCV: 92.2 fL (ref 78.0–100.0)
Monocytes Absolute: 0.1 10*3/uL (ref 0.1–1.0)
Monocytes Relative: 2 %
NEUTROS PCT: 94 %
Neutro Abs: 7.9 10*3/uL — ABNORMAL HIGH (ref 1.7–7.7)
Platelets: 189 10*3/uL (ref 150–400)
RBC: 3.73 MIL/uL — AB (ref 3.87–5.11)
RDW: 14.7 % (ref 11.5–15.5)
WBC: 8.4 10*3/uL (ref 4.0–10.5)

## 2017-04-13 LAB — HEPATIC FUNCTION PANEL
ALT: 97 U/L — AB (ref 14–54)
AST: 250 U/L — AB (ref 15–41)
Albumin: 2.7 g/dL — ABNORMAL LOW (ref 3.5–5.0)
Alkaline Phosphatase: 29 U/L — ABNORMAL LOW (ref 38–126)
BILIRUBIN DIRECT: 0.1 mg/dL (ref 0.1–0.5)
Indirect Bilirubin: 0.2 mg/dL — ABNORMAL LOW (ref 0.3–0.9)
TOTAL PROTEIN: 6.3 g/dL — AB (ref 6.5–8.1)
Total Bilirubin: 0.3 mg/dL (ref 0.3–1.2)

## 2017-04-13 LAB — COMPREHENSIVE METABOLIC PANEL
ALK PHOS: 25 U/L — AB (ref 38–126)
ALT: 102 U/L — AB (ref 14–54)
AST: 231 U/L — AB (ref 15–41)
Albumin: 2.7 g/dL — ABNORMAL LOW (ref 3.5–5.0)
Anion gap: 8 (ref 5–15)
BILIRUBIN TOTAL: 0.4 mg/dL (ref 0.3–1.2)
BUN: 17 mg/dL (ref 6–20)
CO2: 24 mmol/L (ref 22–32)
CREATININE: 0.84 mg/dL (ref 0.44–1.00)
Calcium: 8.7 mg/dL — ABNORMAL LOW (ref 8.9–10.3)
Chloride: 105 mmol/L (ref 101–111)
GFR calc Af Amer: >60 mL/min (ref >60)
Glucose, Bld: 166 mg/dL — ABNORMAL HIGH (ref 65–99)
Potassium: 3.9 mmol/L (ref 3.5–5.1)
Sodium: 137 mmol/L (ref 135–145)
TOTAL PROTEIN: 6.3 g/dL — AB (ref 6.5–8.1)

## 2017-04-13 LAB — BASIC METABOLIC PANEL
Anion gap: 15 (ref 5–15)
BUN: 16 mg/dL (ref 6–20)
CALCIUM: 8.5 mg/dL — AB (ref 8.9–10.3)
CO2: 17 mmol/L — ABNORMAL LOW (ref 22–32)
CREATININE: 0.84 mg/dL (ref 0.44–1.00)
Chloride: 104 mmol/L (ref 101–111)
GFR calc non Af Amer: >60 mL/min (ref >60)
Glucose, Bld: 160 mg/dL — ABNORMAL HIGH (ref 65–99)
Potassium: 4.1 mmol/L (ref 3.5–5.1)
SODIUM: 136 mmol/L (ref 135–145)

## 2017-04-13 LAB — GLUCOSE, CAPILLARY: GLUCOSE-CAPILLARY: 186 mg/dL — AB (ref 65–99)

## 2017-04-13 LAB — STREP PNEUMONIAE URINARY ANTIGEN: Strep Pneumo Urinary Antigen: NEGATIVE

## 2017-04-13 MED ORDER — AZITHROMYCIN 500 MG PO TABS: 500.0000 mg | ORAL_TABLET | Freq: Every day | ORAL | 0 refills | Status: DC

## 2017-04-13 MED ORDER — CEFUROXIME AXETIL 500 MG PO TABS: 500.0000 mg | ORAL_TABLET | Freq: Two times a day (BID) | ORAL | 0 refills | Status: DC

## 2017-04-13 MED ORDER — ORAL CARE MOUTH RINSE
15.0000 mL | Freq: Two times a day (BID) | OROMUCOSAL | Status: DC
  Administered 2017-04-13: 15 mL via OROMUCOSAL

## 2017-04-13 MED ORDER — PRO-STAT SUGAR FREE PO LIQD: 30.0000 mL | Freq: Two times a day (BID) | ORAL | Status: DC

## 2017-04-13 NOTE — Progress Notes (Signed)
Initial Nutrition Assessment  DOCUMENTATION CODES:  Morbid obesity  INTERVENTION:  Food requests noted and passed to dietary  Will order 30 mL Prostat BID, each supplement provides 100 kcal and 15 grams of protein.  Using weights via care everywhere. Noted she is losing weight. ~15 Lbs x3 months. She states stress is cause. Correlate appropriately.   NUTRITION DIAGNOSIS:  Unintentional weight loss related to poor appetite, acute illness, Stress? as evidenced by loss of 5.8% bw x 3 months  GOAL:  Patient will meet greater than or equal to 90% of their needs  MONITOR:  PO intake, Supplement acceptance, Labs, Weight trends, Work up  REASON FOR ASSESSMENT:  Malnutrition Screening Tool    ASSESSMENT:  61 y/o female PMHx HTN, COPD, Morbid obesity. Presented via EMS due to AMS and "not acting right". Worked up for elevated LFTs of uncertain etiology and  respiratory failure potentially r/t COPD exacerbation vs PNA.   Pt is seen sitting up in chair. She says she has had a poor appetite recently, but does not know why. She eats 2 meals/day. She does not know if they are any smaller than usual. She takes a one-a-day mvi and a Calc+ D supplement. She says she didn't drink any supplements because she is lactose intolerant  Denies any n/v/c/d. Denies her COPD impairing her ability to eat.   Per Care Everywhere and wt history, she has lost 17 lbs x3 months (5.8%). She has an initial weight at hospital of 172 lbs, however, am uncertain that this was a measurement vs estimated/reported as it is quite different from her subsequent weight.   RD asked about other factors that contribute to weight loss including illness or stress. She says she has been very stress out recently as she was evicted and needed to find a new place to live.   She ate very poorly this morning. She didn't like options at all. RD offered alternatives. She likes relatively little of our options. She did accept cottage cheese  with pineapple and yogurt. This is odd as she stated she was lactose intolerant. However, she desired it and the order was Passed order to dietary.   Physical Exam: Obese, no discernible wasting  Labs: Albumin 2.7, Elevated liver enzymes?-does have history of ETOH abuse, Glu 160-166.  Meds   Recent Labs Lab 04/12/17 1237 04/13/17 0454 04/13/17 1222  NA 133* 136 137  K 3.7 4.1 3.9  CL 96* 104 105  CO2 30 17* 24  BUN 22* 16 17  CREATININE 1.33* 0.84 0.84  CALCIUM 8.9 8.5* 8.7*  GLUCOSE 123* 160* 166*   Diet Order:  Diet regular Room service appropriate? Yes; Fluid consistency: Thin  Skin:  Reviewed, no issues  Last BM:  6/6  Height:  Ht Readings from Last 1 Encounters:  04/12/17 5\' 6"  (1.676 m)   Weight:  Wt Readings from Last 1 Encounters:  04/12/17 279 lb 5.2 oz (126.7 kg)   Care everywhere weights: 02/25/2016: 291.6 lbs 12/14/2016: 296 lbs 01/12/2017: 292 lbs 01/24/2017: 287 lbs 02/24/2017: 287 lbs 03/15/2017: 280.8 lbs 03/31/2017: 275 lbs  Ideal Body Weight:  59.1 kg  BMI:  Body mass index is 45.08 kg/m.  Estimated Nutritional Needs:  Kcal:  1650-1900 (13-15 kcal/kg bw) Protein:  65-77 g Pro (1.1-1.3 g/kg ibw) Fluid:  1.7-1.9 L fluid ( 541ml/kcal)  EDUCATION NEEDS:  No education needs identified at this time  Christophe LouisNathan Loma [place] RD, LDN, CNSC Clinical Nutrition Pager: [phone] 04/13/2017 2:04 PM

## 2017-04-13 NOTE — Care Management Note (Signed)
Case Management Note  Patient Details  Name: Sydney LemmaHelen Cruz MRN: [id number] Date of Birth: 09/26/1956  Subjective/Objective:                  Pt admitted with resp failure. She is from home, lives with her son. She is ind with ADL's. No HH services pta. She has no oxygen or neb machine pta. She does not drive herself to appointments, her son drives her. She has PCP in [place] (she recently moved). She voices need for 3 in 1 and has no preference of DME agency.   Action/Plan: Pt plans to DC home with self care. Pt will need to wean from oxygen or have home O2 assessment. Sydney BailiffLinda Cruz, [facility] rep, aware of referral and will obtain pt info from chart and deliver 3 in 1 to pt room prior to DC. CM will give pt list of PCP's in [place] accepting pts. CM will cont to follow.   Expected Discharge Date:      04/14/2017            Expected Discharge Plan:  Home/Self Care  In-House Referral:  NA  Discharge planning Services  CM Consult  Post Acute Care Choice:  Durable Medical Equipment Choice offered to:  Patient  DME Arranged:  3-N-1 DME Agency:  [facility]  Status of Service:  In process, will continue to follow  Sydney MetroChildress, Sydney Retz Demske, RN 04/13/2017, 3:10 PM

## 2017-04-13 NOTE — Progress Notes (Signed)
Dr. Irene LimboGoodrich notified of maintained ambulatory O2 sats >95% on R/A (pt ambulated approx. 30 feet).  MD instructed to discharge pt home.

## 2017-04-13 NOTE — Progress Notes (Signed)
All discharge instructions reviewed in detail with pt and pt's son; understanding verbalized by both.  No questions or concerns prior to discharge. VSS including 02-97% R/A.  Respirations even/unlabored.  Pt discharged to home with son in stable condition.

## 2017-04-13 NOTE — Care Management (Signed)
    Durable Medical Equipment        Start     Ordered   04/13/17 1509  For home use only DME 3 n 1  Once     04/13/17 1508

## 2017-04-13 NOTE — Care Management Obs Status (Signed)
MEDICARE OBSERVATION STATUS NOTIFICATION   Patient Details  Name: Sydney LemmaHelen Burks MRN: [id number] Date of Birth: 01/31/1956   Medicare Observation Status Notification Given:  Yes    Malcolm MetroChildress, Finesse Fielder Demske, RN 04/13/2017, 3:07 PM

## 2017-04-13 NOTE — Discharge Summary (Signed)
Physician Discharge Summary  Sydney Cruz RUE:[id number] DOB: Jan 26, 1956 DOA: 04/12/2017  PCP: Patient, No Pcp Per  Admit date: 04/12/2017 Discharge date: 04/14/2017  Recommendations for Outpatient Follow-up:  1. Resolution of pneumonia 2. Follow-up for hepatic steatosis, elevated transaminases 3. Thoracic aortic atherosclerosis 4. Morbid obesity 5. Patient encouraged to establish with primary care physician in this area     Discharge Diagnoses:  1. Early sepsis secondary to lobar pneumonia 2. Acute hypoxic respiratory failure 3. Elevated transaminases secondary to hepatic steatosis by imaging 4. COPD 5. Thoracic aortic atherosclerosis 6. Morbid obesity  Discharge Condition: Improved Disposition: Home  Diet recommendation: Regular  Filed Weights   04/12/17 1216 04/12/17 1834  Weight: 123.4 kg (272 lb) 126.7 kg (279 lb 5.2 oz)    History of present illness:  61 year old woman PMH COPD not on home oxygen, chronic right shoulder dislocation, presented with shortness of breath, fatigue, weakness. Admitted for acute hypoxic respiratory failure, pneumonia.  Hospital Course:  Patient was treated with empiric antibiotics with a rapid clinical improvement, resolution of shortness of breath and hypoxia. Given rapid clinical improvement, she was transitioned to oral antibiotics and discharged home. Hospitalization was uncomplicated.  Today's assessment: S: Feels well. Breathing much better. Wants to go home. O: Vitals: Temperature 98.6, respirations 22, pulse 69, blood pressure 117/58   Constitutional. Appears calm, comfortable.  Cardiovascular: Regular rate and rhythm. No murmur, rub or gallop.  Respiratory clear to auscultation bilaterally. No wheezes, rales or rhonchi. Normal respiratory effort.   Psychiatric: Grossly normal mood and affect. Speech fluent and appropriate  Discharge Instructions  Discharge Instructions    Diet general    Complete by:  As directed    Discharge instructions    Complete by:  As directed    Call your physician or seek immediate medical attention for shortness of breath, fever or worsening of condition. Be sure to see a primary care doctor in your area.   Increase activity slowly    Complete by:  As directed      Allergies as of 04/13/2017   No Known Allergies     Medication List    STOP taking these medications   naproxen 500 MG tablet Commonly known as:  NAPROSYN     TAKE these medications   acetaminophen-codeine 300-30 MG tablet Commonly known as:  TYLENOL #3 TAKE 1 TABLET BY MOUTH ONCE DAILY AS NEEDED FOR PAIN   albuterol 108 (90 Base) MCG/ACT inhaler Commonly known as:  PROVENTIL HFA;VENTOLIN HFA Inhale into the lungs.   amLODipine 10 MG tablet Commonly known as:  NORVASC Take 10 mg by mouth daily.   azithromycin 500 MG tablet Commonly known as:  ZITHROMAX Take 1 tablet (500 mg total) by mouth daily.   cefUROXime 500 MG tablet Commonly known as:  CEFTIN Take 1 tablet (500 mg total) by mouth 2 (two) times daily with a meal. Start 6/8 in the morning.   lisinopril-hydrochlorothiazide 20-25 MG tablet Commonly known as:  PRINZIDE,ZESTORETIC Take 1 tablet by mouth daily.      No Known Allergies  The results of significant diagnostics from this hospitalization (including imaging, microbiology, ancillary and laboratory) are listed below for reference.    Significant Diagnostic Studies: Ct Angio Chest Pe W Or Wo Contrast  Result Date: 04/13/2017 CLINICAL DATA:  Chronic shoulder dislocation with plan surgical correction tomorrow. Patient presents with altered mental status. EXAM: CT ANGIOGRAPHY CHEST WITH CONTRAST TECHNIQUE: Multidetector CT imaging of the chest was performed using the standard protocol during bolus administration  of intravenous contrast. Multiplanar CT image reconstructions and MIPs were obtained to evaluate the vascular anatomy. CONTRAST:  100 cc Isovue 370 IV COMPARISON:  None.  FINDINGS: Cardiovascular: Normal branch pattern of the great vessels. No aortic aneurysm or dissection. No pulmonary embolus. Heart size is normal without pericardial effusion. Mediastinum/Nodes: No enlarged mediastinal, hilar, or axillary lymph nodes. There are prevascular, left hilar and left tracheobronchial soft tissue densities suspicious for mildly enlarged lymph nodes, likely reactive. Thyroid gland, trachea, and esophagus demonstrate no significant findings. Lungs/Pleura: Left lower lobe pneumonic consolidation with air bronchograms. Right lung is clear. There is no pneumothorax. Upper Abdomen: Hepatic steatosis. Mild reflux of contrast into the hepatic veins. No splenomegaly. Musculoskeletal: Anterior dislocated humeral head with impaction on the glenoid. Avulsion of the greater tuberosity is noted. There is a small to moderate right glenohumeral joint effusion. Review of the MIP images confirms the above findings. IMPRESSION: 1. Left lower lobe pneumonic consolidation with air bronchograms. Reactive mediastinal left hilar lymph nodes. 2. No acute pulmonary embolus. 3. Anterior dislocation of the humeral head with impaction on the glenoid. Avulsed appearing greater tuberosity with small to moderate right glenohumeral joint effusion. Electronically Signed   By: Tollie Ethavid  Kwon M.D.   On: 04/13/2017 00:10   Dg Chest Portable 1 View  Result Date: 04/12/2017 CLINICAL DATA:  S post fall out of bed 2 days ago. Hypoxia. Current smoker. EXAM: PORTABLE CHEST 1 VIEW COMPARISON:  None in PACs FINDINGS: The increased overlying soft tissues results inscatter effects limiting the technical quality of the radiograph. The lungs are adequately inflated. There is no focal infiltrate. The heart and pulmonary vascularity are normal. The mediastinum is normal in width. There is faint calcification in the wall of the aortic arch. The observed bony thorax reveals abnormal appearance of the left shoulder worrisome for acute  dislocation with avulsion of the greater tuberosity. IMPRESSION: Limited study due to overlying soft tissues. No definite acute cardiopulmonary abnormality. Thoracic aortic atherosclerosis. Abnormal appearance of the right humeral head consistent with dislocation inferiorly as well as avulsion of the greater tuberosity. A right shoulder series is recommended. Electronically Signed   By: David  Swazil[place]Jordan M.D.   On: 04/12/2017 12:42    Microbiology: Recent Results (from the past 240 hour(s))  Blood culture (routine x 2)     Status: None (Preliminary result)   Collection Time: 04/12/17 12:37 PM  Result Value Ref Range Status   Specimen Description LEFT ANTECUBITAL  Final   Special Requests   Final    BOTTLES DRAWN AEROBIC AND ANAEROBIC Blood Culture adequate volume   Culture NO GROWTH < 24 HOURS  Final   Report Status PENDING  Incomplete  Blood culture (routine x 2)     Status: None (Preliminary result)   Collection Time: 04/12/17 12:45 PM  Result Value Ref Range Status   Specimen Description RIGHT ANTECUBITAL  Final   Special Requests   Final    BOTTLES DRAWN AEROBIC AND ANAEROBIC Blood Culture adequate volume   Culture NO GROWTH < 24 HOURS  Final   Report Status PENDING  Incomplete  Urine culture     Status: None   Collection Time: 04/12/17  6:37 PM  Result Value Ref Range Status   Specimen Description URINE, RANDOM  Final   Special Requests NONE  Final   Culture   Final    NO GROWTH Performed at [facility], [address] [place], [place] [address]    Report Status 04/14/2017 FINAL  Final     Labs: Basic Metabolic Panel:  Recent Labs Lab 04/12/17 1237 04/13/17 0454 04/13/17 1222  NA 133* 136 137  K 3.7 4.1 3.9  CL 96* 104 105  CO2 30 17* 24  GLUCOSE 123* 160* 166*  BUN 22* 16 17  CREATININE 1.33* 0.84 0.84  CALCIUM 8.9 8.5* 8.7*   Liver Function Tests:  Recent Labs Lab 04/12/17 1237 04/13/17 0454 04/13/17 1222  AST 252* 250* 231*  ALT 73* 97* 102*    ALKPHOS 27* 29* 25*  BILITOT 0.8 0.3 0.4  PROT 6.8 6.3* 6.3*  ALBUMIN 3.2* 2.7* 2.7*   CBC:  Recent Labs Lab 04/12/17 1218 04/13/17 0454  WBC 11.1* 8.4  NEUTROABS 9.7* 7.9*  HGB 12.0 11.2*  HCT 36.5 34.4*  MCV 92.4 92.2  PLT 194 189    CBG:  Recent Labs Lab 04/12/17 1214 04/13/17 1629  GLUCAP 129* 186*    Principal Problem:   Acute on chronic respiratory failure with hypoxia (HCC) Active Problems:   Elevated LFTs   Hyperglycemia   Sepsis (HCC)   Essential hypertension   Chronic dislocation of right shoulder   Time coordinating discharge: 35 minutes  Signed:  Brendia Sacks, MD [facility] 04/14/2017, 8:59 AM

## 2017-04-14 DIAGNOSIS — I700 Atherosclerosis of aorta: Secondary | ICD-10-CM

## 2017-04-14 LAB — URINE CULTURE: Culture: NO GROWTH

## 2017-04-14 LAB — HIV ANTIBODY (ROUTINE TESTING W REFLEX): HIV SCREEN 4TH GENERATION: NONREACTIVE

## 2017-04-17 LAB — CULTURE, BLOOD (ROUTINE X 2)
Culture: NO GROWTH
Culture: NO GROWTH
Special Requests: ADEQUATE
Special Requests: ADEQUATE

## 2017-04-25 ENCOUNTER — Encounter: Payer: Self-pay | Admitting: Family Medicine

## 2017-04-25 ENCOUNTER — Ambulatory Visit (INDEPENDENT_AMBULATORY_CARE_PROVIDER_SITE_OTHER): Payer: Medicare Other | Admitting: Family Medicine

## 2017-04-25 VITALS — BP 133/74 | HR 99 | Temp 98.2°F | Ht 66.5 in | Wt 260.0 lb

## 2017-04-25 DIAGNOSIS — I10 Essential (primary) hypertension: Secondary | ICD-10-CM | POA: Diagnosis not present

## 2017-04-25 DIAGNOSIS — M24411 Recurrent dislocation, right shoulder: Secondary | ICD-10-CM | POA: Diagnosis not present

## 2017-04-25 DIAGNOSIS — Z720 Tobacco use: Secondary | ICD-10-CM

## 2017-04-25 MED ORDER — BUPROPION HCL ER (SR) 150 MG PO TB12: 150.0000 mg | ORAL_TABLET | Freq: Two times a day (BID) | ORAL | 2 refills | Status: AC

## 2017-04-25 NOTE — Progress Notes (Signed)
   HPI  Patient presents today here to establish care, she would like to quit smoking.  Smoking Approximate 40 year history of 1 pack per day smoking. She has recently cut back to less than one half pack per day. She needs help with cravings. She has heard of the patch and Chantix. She does not have much concern of depression, she denies any suicidal thoughts.  HTN Good medication compliance, no chest pain or heart disease.  Chronic dislocation of the right shoulder Patient with long-standing history issue with his right shoulder, she's planning to have surgery next month, she's been [facility]. She's been tested for COPD  PMH: Arthritis, chronic back pain, chronic dislocation of right shoulder, possible COPD, hypertension Past family medical history: Hypertension in father, heart problems and mother Past surgical history: Tubal ligation Past surgical history: Current smoker, current alcohol use occasionally, denies drug use ROS: Per HPI  Objective: BP 133/74   Pulse 99   Temp 98.2 F (36.8 C) (Oral)   Ht 5' 6.5" (1.689 m)   Wt 260 lb (117.9 kg)   BMI 41.34 kg/m  Gen: NAD, alert, cooperative with exam HEENT: NCAT, oropharynx moist and clear, PERRLA, TMs normal bilaterally CV: RRR, good S1/S2, no murmur Resp: CTABL, no wheezes, non-labored Ext: No edema, warm Neuro: Alert and oriented, 1+ symmetric patellar tendon reflexes bilaterally,   Assessment and plan:  # Tobacco abuse Patient desires to quit Start Wellbutrin, 1 pill once daily for 3 days then increase to 1 pill twice daily, quit date 1 week after starting medication Recommended calling 1800 Quit now for support   # Hypertension Well-controlled on current medications, Prinzide plus amlodipine Patient states labs have recently been checked at [facility], they are not available in EMR this time Offered labs, she will follow-up there and then come back to us in about  4 weeks.  # Chronic dislocation of the right shoulder She appears to have chronic malunion of a traumatic fracture of her right humerus, she plans to have orthopedic surgery for this in a few weeks. I believe she is getting preoperative evaluation at [facility]. She will return to me if she needs preop evaluation Getting PFTs to eval for COPD  Meds ordered this encounter  Medications  . buPROPion (WELLBUTRIN SR) 150 MG 12 hr tablet    Sig: Take 1 tablet (150 mg total) by mouth 2 (two) times daily.    Dispense:  60 tablet    Refill:  2    Murtis SinkSam Alithea Lapage, MD [facility] 04/25/2017, 1:55 PM

## 2017-04-25 NOTE — Patient Instructions (Signed)
Great to meet you!  Take wellbutrin 1 pill daily for 3 days, then increase to 1 pill twice daily. Begin Wellbutrin 1 week before your target quit date. Continue for at least 3 months.  Follow up here in 3-4 weeks  Call  1800 Quit now

## 2017-05-15 DIAGNOSIS — Z9181 History of falling: Secondary | ICD-10-CM | POA: Diagnosis not present

## 2017-05-15 DIAGNOSIS — Z8249 Family history of ischemic heart disease and other diseases of the circulatory system: Secondary | ICD-10-CM | POA: Diagnosis not present

## 2017-05-15 DIAGNOSIS — Z803 Family history of malignant neoplasm of breast: Secondary | ICD-10-CM | POA: Diagnosis not present

## 2017-05-15 DIAGNOSIS — J449 Chronic obstructive pulmonary disease, unspecified: Secondary | ICD-10-CM | POA: Diagnosis not present

## 2017-05-15 DIAGNOSIS — I10 Essential (primary) hypertension: Secondary | ICD-10-CM | POA: Diagnosis not present

## 2017-05-15 DIAGNOSIS — Z888 Allergy status to other drugs, medicaments and biological substances status: Secondary | ICD-10-CM | POA: Diagnosis not present

## 2017-05-15 DIAGNOSIS — S46211A Strain of muscle, fascia and tendon of other parts of biceps, right arm, initial encounter: Secondary | ICD-10-CM | POA: Diagnosis not present

## 2017-05-15 DIAGNOSIS — S4291XA Fracture of right shoulder girdle, part unspecified, initial encounter for closed fracture: Secondary | ICD-10-CM | POA: Diagnosis not present

## 2017-05-15 DIAGNOSIS — Z833 Family history of diabetes mellitus: Secondary | ICD-10-CM | POA: Diagnosis not present

## 2017-05-15 DIAGNOSIS — Z791 Long term (current) use of non-steroidal anti-inflammatories (NSAID): Secondary | ICD-10-CM | POA: Diagnosis not present

## 2017-05-15 DIAGNOSIS — D649 Anemia, unspecified: Secondary | ICD-10-CM | POA: Diagnosis not present

## 2017-05-15 DIAGNOSIS — Z79899 Other long term (current) drug therapy: Secondary | ICD-10-CM | POA: Diagnosis not present

## 2017-05-17 ENCOUNTER — Ambulatory Visit: Payer: Self-pay | Admitting: Family Medicine

## 2017-05-19 DIAGNOSIS — Z8249 Family history of ischemic heart disease and other diseases of the circulatory system: Secondary | ICD-10-CM | POA: Diagnosis not present

## 2017-05-19 DIAGNOSIS — Z9181 History of falling: Secondary | ICD-10-CM | POA: Diagnosis not present

## 2017-05-19 DIAGNOSIS — M25511 Pain in right shoulder: Secondary | ICD-10-CM | POA: Diagnosis not present

## 2017-05-19 DIAGNOSIS — S4291XK Fracture of right shoulder girdle, part unspecified, subsequent encounter for fracture with nonunion: Secondary | ICD-10-CM | POA: Diagnosis not present

## 2017-05-19 DIAGNOSIS — M75111 Incomplete rotator cuff tear or rupture of right shoulder, not specified as traumatic: Secondary | ICD-10-CM | POA: Diagnosis not present

## 2017-05-19 DIAGNOSIS — Z79899 Other long term (current) drug therapy: Secondary | ICD-10-CM | POA: Diagnosis not present

## 2017-05-19 DIAGNOSIS — Z791 Long term (current) use of non-steroidal anti-inflammatories (NSAID): Secondary | ICD-10-CM | POA: Diagnosis not present

## 2017-05-19 DIAGNOSIS — Z803 Family history of malignant neoplasm of breast: Secondary | ICD-10-CM | POA: Diagnosis not present

## 2017-05-19 DIAGNOSIS — S43004D Unspecified dislocation of right shoulder joint, subsequent encounter: Secondary | ICD-10-CM | POA: Diagnosis not present

## 2017-05-19 DIAGNOSIS — Z833 Family history of diabetes mellitus: Secondary | ICD-10-CM | POA: Diagnosis not present

## 2017-05-19 DIAGNOSIS — M85811 Other specified disorders of bone density and structure, right shoulder: Secondary | ICD-10-CM | POA: Diagnosis not present

## 2017-05-19 DIAGNOSIS — S46211A Strain of muscle, fascia and tendon of other parts of biceps, right arm, initial encounter: Secondary | ICD-10-CM | POA: Diagnosis not present

## 2017-05-19 DIAGNOSIS — M7521 Bicipital tendinitis, right shoulder: Secondary | ICD-10-CM | POA: Diagnosis not present

## 2017-05-19 DIAGNOSIS — D649 Anemia, unspecified: Secondary | ICD-10-CM | POA: Diagnosis not present

## 2017-05-19 DIAGNOSIS — Z96611 Presence of right artificial shoulder joint: Secondary | ICD-10-CM | POA: Diagnosis not present

## 2017-05-19 DIAGNOSIS — Z888 Allergy status to other drugs, medicaments and biological substances status: Secondary | ICD-10-CM | POA: Diagnosis not present

## 2017-05-19 DIAGNOSIS — J449 Chronic obstructive pulmonary disease, unspecified: Secondary | ICD-10-CM | POA: Diagnosis not present

## 2017-05-19 DIAGNOSIS — S4291XA Fracture of right shoulder girdle, part unspecified, initial encounter for closed fracture: Secondary | ICD-10-CM | POA: Diagnosis not present

## 2017-05-19 DIAGNOSIS — I10 Essential (primary) hypertension: Secondary | ICD-10-CM | POA: Diagnosis not present

## 2017-05-19 DIAGNOSIS — G8918 Other acute postprocedural pain: Secondary | ICD-10-CM | POA: Diagnosis not present

## 2017-06-05 ENCOUNTER — Other Ambulatory Visit: Payer: Self-pay | Admitting: Family Medicine

## 2017-06-05 DIAGNOSIS — Z4789 Encounter for other orthopedic aftercare: Secondary | ICD-10-CM | POA: Diagnosis not present

## 2017-06-05 DIAGNOSIS — Z79899 Other long term (current) drug therapy: Secondary | ICD-10-CM | POA: Diagnosis not present

## 2017-06-05 DIAGNOSIS — Z888 Allergy status to other drugs, medicaments and biological substances status: Secondary | ICD-10-CM | POA: Diagnosis not present

## 2017-06-05 DIAGNOSIS — I10 Essential (primary) hypertension: Secondary | ICD-10-CM | POA: Diagnosis not present

## 2017-06-05 DIAGNOSIS — Z96611 Presence of right artificial shoulder joint: Secondary | ICD-10-CM | POA: Diagnosis not present

## 2017-06-05 DIAGNOSIS — J449 Chronic obstructive pulmonary disease, unspecified: Secondary | ICD-10-CM | POA: Diagnosis not present

## 2017-06-05 DIAGNOSIS — Z7982 Long term (current) use of aspirin: Secondary | ICD-10-CM | POA: Diagnosis not present

## 2017-06-06 MED ORDER — LISINOPRIL-HYDROCHLOROTHIAZIDE 20-25 MG PO TABS: 1.0000 | ORAL_TABLET | Freq: Every day | ORAL | 4 refills | Status: AC

## 2017-06-06 NOTE — Telephone Encounter (Signed)
done

## 2017-06-07 IMAGING — CT CT ANGIO CHEST
2 of 6 series · 18 of 36 positions shown · IV contrast (Isovue)
Comparison: None.

CLINICAL DATA: Chronic shoulder dislocation with plan surgical
correction tomorrow. Patient presents with altered mental status.

EXAM:
CT ANGIOGRAPHY CHEST WITH CONTRAST
TECHNIQUE: Multidetector CT imaging of the chest was performed using the
standard protocol during bolus administration of intravenous
contrast. Multiplanar CT image reconstructions and MIPs were
obtained to evaluate the vascular anatomy.
CONTRAST:  100 cc Isovue 370 IV

[Series 5: thins · axial · 0.98mm/px · z∈[+1166,+1406]mm · 17 of 267 slices shown]
[im 14/267  lung]
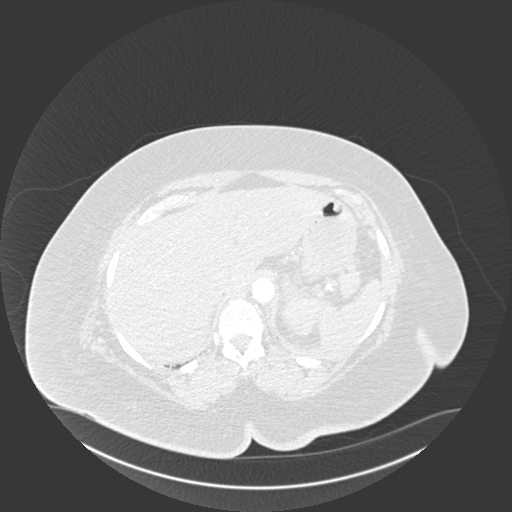
[im 27/267  mediastinal]
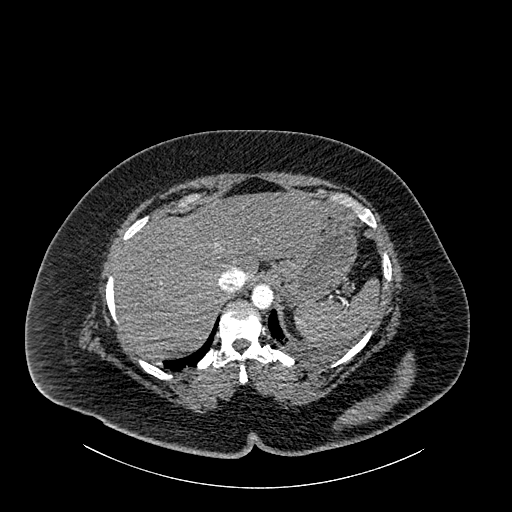
[im 40/267  lung]
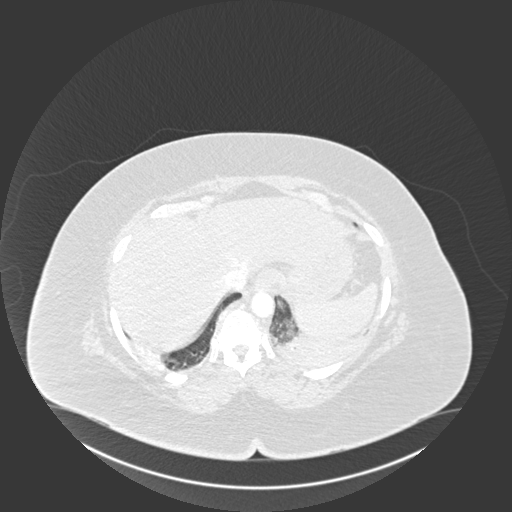
[im 54/267  mediastinal]
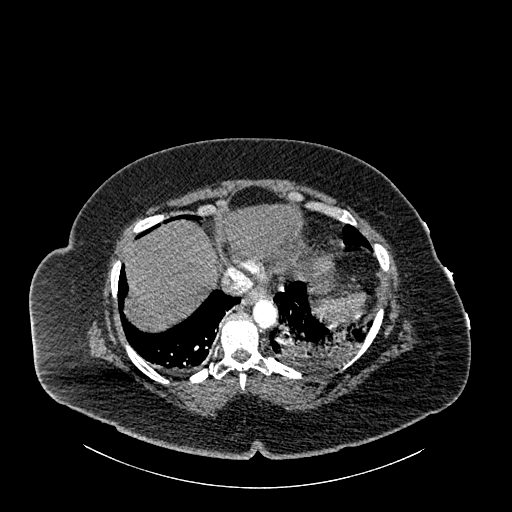
[im 80/267  lung]
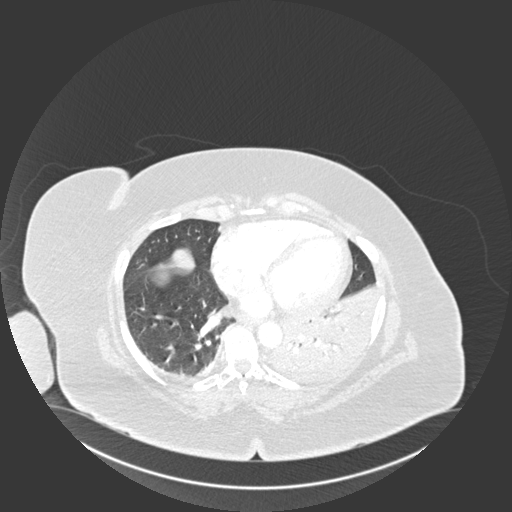
[im 94/267  mediastinal]
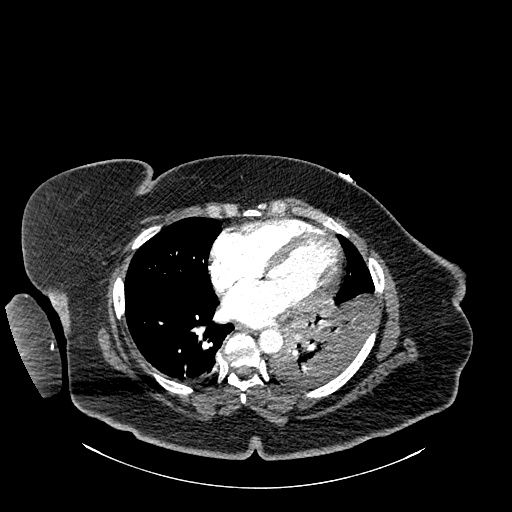
[im 107/267  lung]
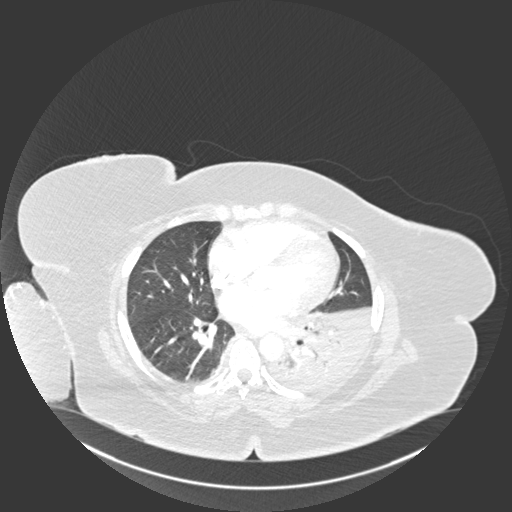
[im 120/267  mediastinal]
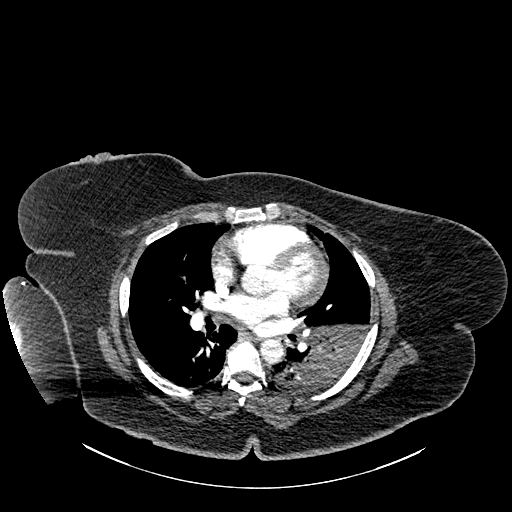
[im 134/267  lung]
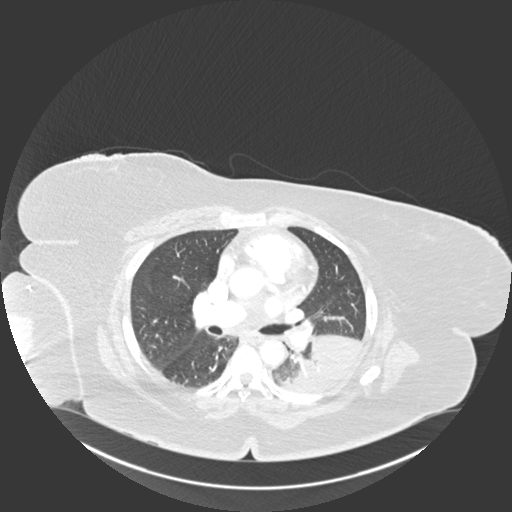
[im 147/267  mediastinal]
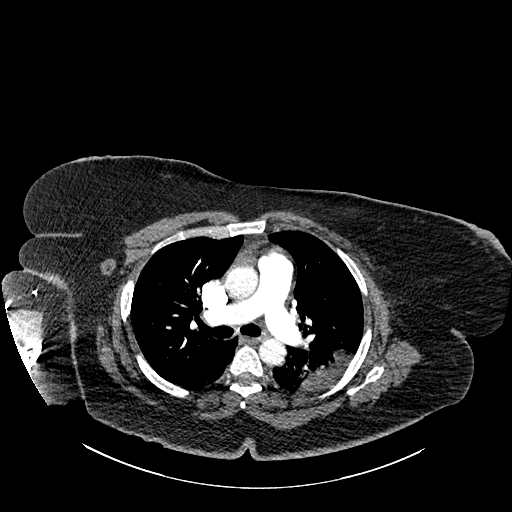
[im 160/267  lung]
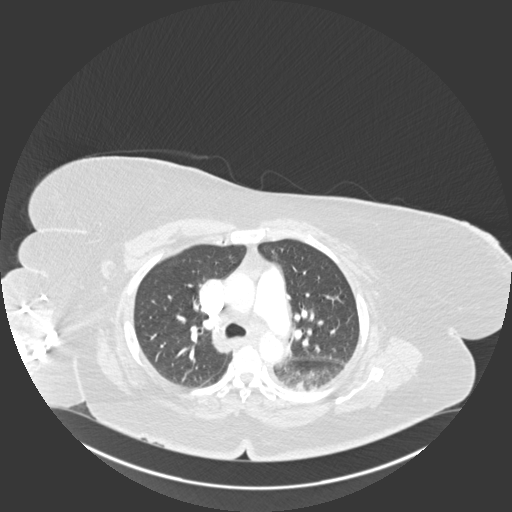
[im 173/267  mediastinal]
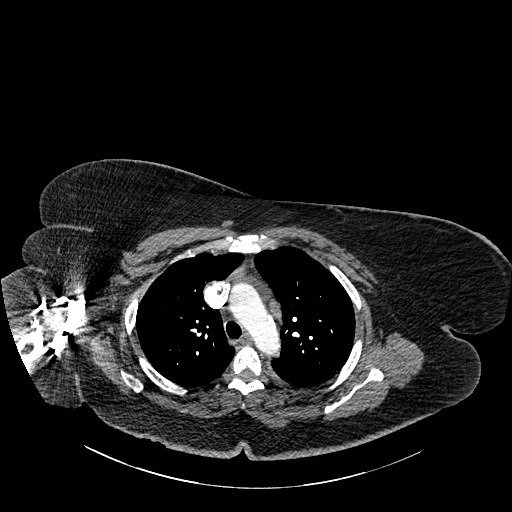
[im 187/267  lung]
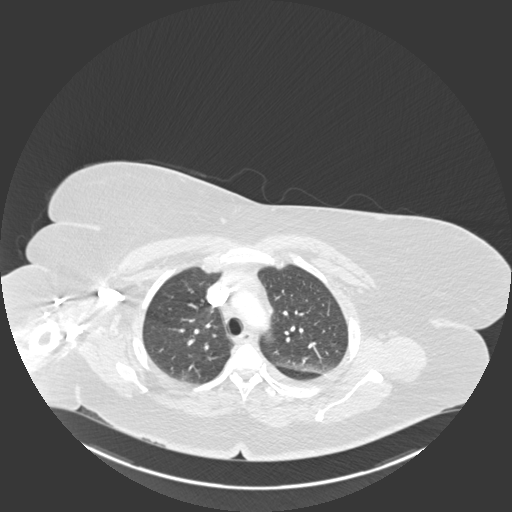
[im 213/267  mediastinal]
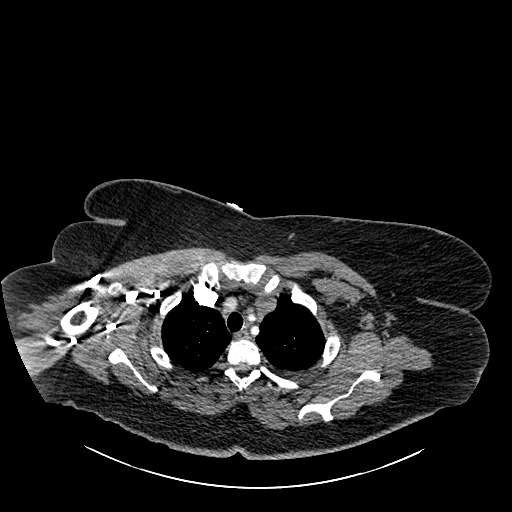
[im 227/267  lung]
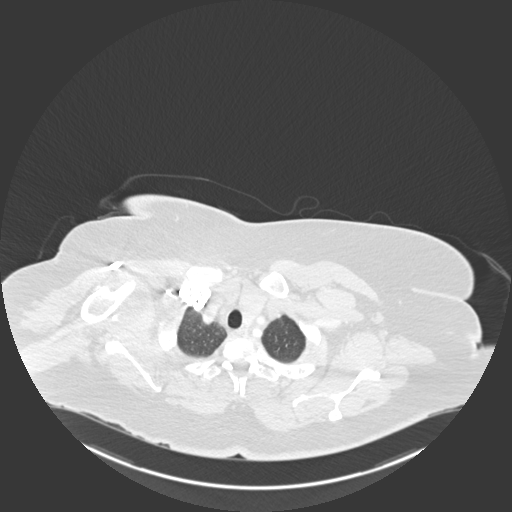
[im 240/267  mediastinal]
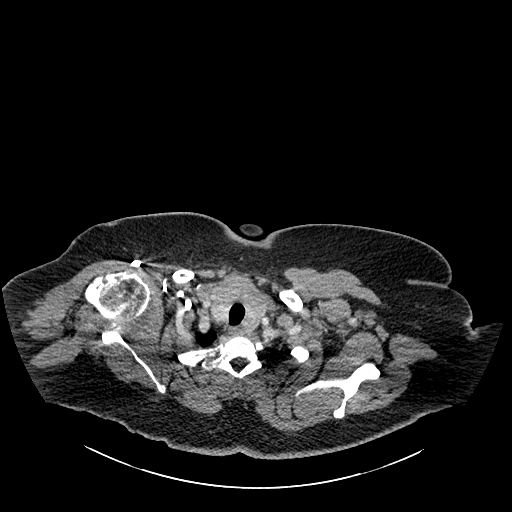
[im 253/267  lung]
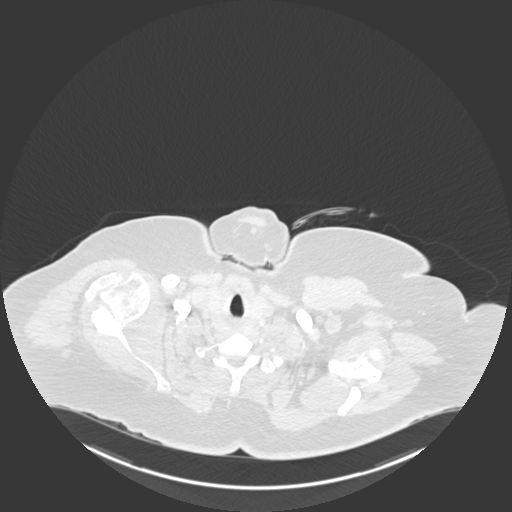

[Series 7: coronal mpr · coronal · 0.62mm/px · 1 of 151 slices shown]
[im 76/151  mediastinal]
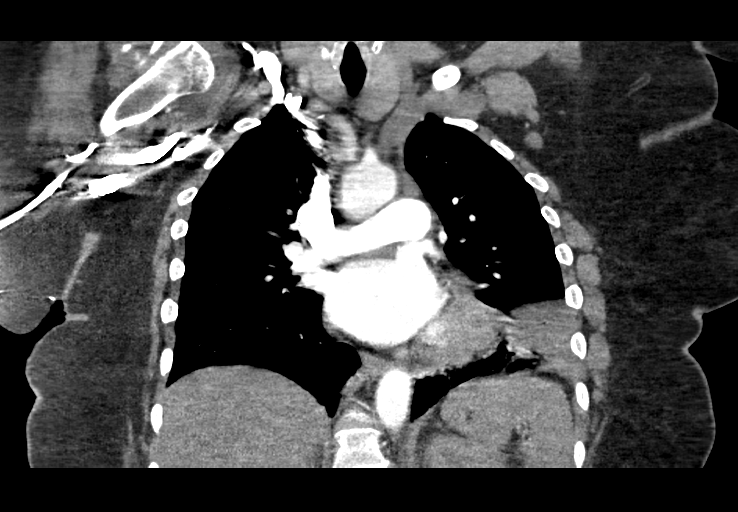

[18 of 36 positions shown; findings below may reference images not displayed]

FINDINGS: Cardiovascular: Normal branch pattern of the great vessels. No
aortic aneurysm or dissection. No pulmonary embolus. Heart size is
normal without pericardial effusion.

Mediastinum/Nodes: No enlarged mediastinal, hilar, or axillary lymph
nodes. There are prevascular, left hilar and left tracheobronchial
soft tissue densities suspicious for mildly enlarged lymph nodes,
likely reactive. Thyroid gland, trachea, and esophagus demonstrate
no significant findings.

Lungs/Pleura: Left lower lobe pneumonic consolidation with air
bronchograms. Right lung is clear. There is no pneumothorax.

Upper Abdomen: Hepatic steatosis. Mild reflux of contrast into the
hepatic veins. No splenomegaly.

Musculoskeletal: Anterior dislocated humeral head with impaction on
the glenoid. Avulsion of the greater tuberosity is noted. There is a
small to moderate right glenohumeral joint effusion.

Review of the MIP images confirms the above findings.
IMPRESSION: 1. Left lower lobe pneumonic consolidation with air bronchograms.
Reactive mediastinal left hilar lymph nodes.
2. No acute pulmonary embolus.
3. Anterior dislocation of the humeral head with impaction on the
glenoid. Avulsed appearing greater tuberosity with small to moderate
right glenohumeral joint effusion.

## 2017-07-06 DIAGNOSIS — Z471 Aftercare following joint replacement surgery: Secondary | ICD-10-CM | POA: Diagnosis not present

## 2017-07-06 DIAGNOSIS — M25511 Pain in right shoulder: Secondary | ICD-10-CM | POA: Diagnosis not present

## 2017-07-06 DIAGNOSIS — Z96611 Presence of right artificial shoulder joint: Secondary | ICD-10-CM | POA: Diagnosis not present

## 2017-07-20 ENCOUNTER — Ambulatory Visit: Payer: Medicare Other | Admitting: Family Medicine

## 2017-08-01 ENCOUNTER — Encounter: Payer: Self-pay | Admitting: Family Medicine

## 2017-08-01 ENCOUNTER — Ambulatory Visit (INDEPENDENT_AMBULATORY_CARE_PROVIDER_SITE_OTHER): Payer: Medicare Other

## 2017-08-01 ENCOUNTER — Ambulatory Visit (INDEPENDENT_AMBULATORY_CARE_PROVIDER_SITE_OTHER): Payer: Medicare Other | Admitting: Family Medicine

## 2017-08-01 VITALS — BP 124/70 | HR 80 | Temp 98.3°F | Ht 66.5 in | Wt 258.8 lb

## 2017-08-01 DIAGNOSIS — M546 Pain in thoracic spine: Secondary | ICD-10-CM | POA: Diagnosis not present

## 2017-08-01 DIAGNOSIS — G8929 Other chronic pain: Secondary | ICD-10-CM | POA: Diagnosis not present

## 2017-08-01 DIAGNOSIS — F17200 Nicotine dependence, unspecified, uncomplicated: Secondary | ICD-10-CM

## 2017-08-01 DIAGNOSIS — I10 Essential (primary) hypertension: Secondary | ICD-10-CM

## 2017-08-01 MED ORDER — NAPROXEN 500 MG PO TABS: 500.0000 mg | ORAL_TABLET | Freq: Two times a day (BID) | ORAL | 0 refills | Status: AC

## 2017-08-01 MED ORDER — ACETAMINOPHEN-CODEINE #3 300-30 MG PO TABS: 1.0000 | ORAL_TABLET | Freq: Three times a day (TID) | ORAL | 0 refills | Status: DC | PRN

## 2017-08-01 NOTE — Patient Instructions (Signed)
Great to see you!  Lets see you again in 1 month to formally walk through our pain medicine contract

## 2017-08-01 NOTE — Progress Notes (Signed)
   HPI  Patient presents today here for follow-up back pain.  Patient explains that she's recently had left shoulder surgery, her pain is improving from that.  Patient explains that she's had degenerative disc disease that was diagnosed years ago, for a few years she's been taking Tylenol 3 plus naproxen for pain. She requests refill Tylenol 3. She has recently used hydrocodone that was prescribed by at [facility], she has finished this. She understands the opiate epidemic in the dangers involved were discussed very clearly.  Hypertension Good medication compliance, no chest pain or headache  PMH: Smoking status noted ROS: Per HPI  Objective: BP 124/70   Pulse 80   Temp 98.3 F (36.8 C) (Oral)   Ht 5' 6.5" (1.689 m)   Wt 258 lb 12.8 oz (117.4 kg)   BMI 41.15 kg/m  Gen: NAD, alert, cooperative with exam HEENT: NCAT, EOMI, PERRL CV: RRR, good S1/S2, no murmur Resp: CTABL, no wheezes, non-labored Ext: No edema, warm Neuro: Alert and oriented, No gross deficits MSK Midline tenderness in the thoracic and upper lumbar area, no paraspinal muscle tenderness to palpation   Lumbar XR 02/25/2016  1.No acute fracture. 2.Mild multilevel degenerative disc disease most pronounced at L5-S1. 3.Severe L4-L5 and L5-S1 and moderate L3-L4 facet osteoarthritis bilaterally. 4.Mild levocurvature centered at the mid lumbar spine. 5.Approximately 3 mm grade 1 anterolisthesis of L4 on L5. 6.Mild degenerative changes of both hips. Assessment and plan:  # Chronic back pain, chronic thoracic and low back pain Lumbar film above, thoracic film is pending today. Patient states that her symptoms are really stable, she goes through periods of time where they're more difficult than others. [place] controlled substance database was reviewed, it is very consistent with the history that she has provided. Given 30 pills of Tylenol 3 with 30 mg codeine, also given naproxen. I discussed  that she'll need to return for pain management contract next month, if her pain medication needs become more significant she'll need to be seen a pain management.  # Hypertension Well-controlled on current medications No changes  # Smoking She has been cutting back on Wellbutrin, no changes Congratulated     Orders Placed This Encounter  Procedures  . DG Thoracic Spine 2 View    Standing Status:   Future    Number of Occurrences:   1    Standing Expiration Date:   10/01/2018    Order Specific Question:   Reason for Exam (SYMPTOM  OR DIAGNOSIS REQUIRED)    Answer:   back pain    Order Specific Question:   Is the patient pregnant?    Answer:   No    Order Specific Question:   Preferred imaging location?    Answer:   Internal    Meds ordered this encounter  Medications  . acetaminophen-codeine (TYLENOL #3) 300-30 MG tablet    Sig: Take 1 tablet by mouth every 8 (eight) hours as needed for moderate pain.    Dispense:  30 tablet    Refill:  0  . naproxen (NAPROSYN) 500 MG tablet    Sig: Take 1 tablet (500 mg total) by mouth 2 (two) times daily with a meal.    Dispense:  60 tablet    Refill:  0    Murtis Sink, MD [facility] 08/01/2017, 5:41 PM

## 2017-10-26 DIAGNOSIS — S4291XK Fracture of right shoulder girdle, part unspecified, subsequent encounter for fracture with nonunion: Secondary | ICD-10-CM | POA: Diagnosis not present

## 2017-10-26 DIAGNOSIS — Z96611 Presence of right artificial shoulder joint: Secondary | ICD-10-CM | POA: Diagnosis not present

## 2017-10-26 DIAGNOSIS — S43011A Anterior subluxation of right humerus, initial encounter: Secondary | ICD-10-CM | POA: Diagnosis not present

## 2017-10-26 DIAGNOSIS — M779 Enthesopathy, unspecified: Secondary | ICD-10-CM | POA: Diagnosis not present

## 2017-10-26 DIAGNOSIS — J449 Chronic obstructive pulmonary disease, unspecified: Secondary | ICD-10-CM | POA: Diagnosis not present

## 2017-10-26 DIAGNOSIS — I10 Essential (primary) hypertension: Secondary | ICD-10-CM | POA: Diagnosis not present

## 2017-10-26 DIAGNOSIS — R200 Anesthesia of skin: Secondary | ICD-10-CM | POA: Diagnosis not present

## 2017-10-26 DIAGNOSIS — Z79899 Other long term (current) drug therapy: Secondary | ICD-10-CM | POA: Diagnosis not present

## 2017-10-26 DIAGNOSIS — S43031A Inferior subluxation of right humerus, initial encounter: Secondary | ICD-10-CM | POA: Diagnosis not present

## 2017-10-26 DIAGNOSIS — Z4789 Encounter for other orthopedic aftercare: Secondary | ICD-10-CM | POA: Diagnosis not present

## 2017-11-13 ENCOUNTER — Ambulatory Visit: Payer: Medicare Other

## 2017-11-14 ENCOUNTER — Ambulatory Visit (INDEPENDENT_AMBULATORY_CARE_PROVIDER_SITE_OTHER): Payer: Medicare Other | Admitting: *Deleted

## 2017-11-14 ENCOUNTER — Encounter: Payer: Self-pay | Admitting: *Deleted

## 2017-11-14 VITALS — BP 139/76 | HR 75 | Ht 66.0 in | Wt 262.0 lb

## 2017-11-14 DIAGNOSIS — Z0000 Encounter for general adult medical examination without abnormal findings: Secondary | ICD-10-CM

## 2017-11-14 NOTE — Patient Instructions (Signed)
  Sydney Cruz , Thank you for taking time to come for your Medicare Wellness Visit. I appreciate your ongoing commitment to your health goals. Please review the following plan we discussed and let me know if I can assist you in the future.   These are the goals we discussed: Increase activity level. Aim for 150 minutes a week. Keep your inhaler with you.   This is a list of the screening recommended for you and due dates:  Health Maintenance  Topic Date Due  .  Hepatitis C: One time screening is recommended by Center for Disease Control  (CDC) for  adults born from [birth year] through 1964.   1956/10/16  . Tetanus Vaccine  07/08/1976  . Pap Smear  07/08/1978  . Colon Cancer Screening  07/09/2007  . Mammogram  09/12/2018  . Flu Shot  Completed  . HIV Screening  Completed

## 2017-11-15 NOTE — Progress Notes (Addendum)
Subjective:   Sydney LemmaHelen Cruz is a 62 y.o. female who presents for an Initial Medicare Annual Wellness Visit. Sydney Cruz has never been married. She lives in an apartment with one of her sons. She has five adult children and 6 grandchildren.   Review of Systems    Health is about the same as last year.   Cardiac Risk Factors include: advanced age (>2855men, 56>65 women);hypertension;smoking/ tobacco exposure;sedentary lifestyle;obesity (BMI >30kg/m2)  Other systems negative today.   Objective:    Today's Vitals   11/14/17 1116 11/14/17 1123  BP:  139/76  Pulse:  75  Weight: 262 lb (118.8 kg) 262 lb (118.8 kg)  Height: 5\' 6"  (1.676 m) 5\' 6"  (1.676 m)  PainSc:  5    Body mass index is 42.29 kg/m.  Advanced Directives 04/12/2017 04/12/2017  Does Patient Have a Medical Advance Directive? No No  Would patient like information on creating a medical advance directive? Yes (Inpatient - patient defers creating a medical advance directive at this time) -    Current Medications (verified) Outpatient Encounter Medications as of 11/14/2017  Medication Sig  . albuterol (PROVENTIL HFA;VENTOLIN HFA) 108 (90 Base) MCG/ACT inhaler Inhale into the lungs.  Marland Kitchen. amLODipine (NORVASC) 10 MG tablet Take 10 mg by mouth daily.   Marland Kitchen. buPROPion (WELLBUTRIN SR) 150 MG 12 hr tablet Take 1 tablet (150 mg total) by mouth 2 (two) times daily.  Marland Kitchen. lisinopril-hydrochlorothiazide (PRINZIDE,ZESTORETIC) 20-25 MG tablet Take 1 tablet by mouth daily.  Marland Kitchen. acetaminophen-codeine (TYLENOL #3) 300-30 MG tablet Take 1 tablet by mouth every 8 (eight) hours as needed for moderate pain. (Patient not taking: Reported on 11/14/2017)  . naproxen (NAPROSYN) 500 MG tablet Take 1 tablet (500 mg total) by mouth 2 (two) times daily with a meal. (Patient not taking: Reported on 11/14/2017)   No facility-administered encounter medications on file as of 11/14/2017.     Allergies (verified) Ranitidine   History: Past Medical History:  Diagnosis Date  .  Arthritis   . Chronic back pain   . Chronic dislocation of right shoulder    for upcoming operative repair  . COPD (chronic obstructive pulmonary disease) (HCC)    has not been formally told she has this  . Hypertension    Past Surgical History:  Procedure Laterality Date  . FRACTURE SURGERY    . TUBAL LIGATION     Family History  Problem Relation Age of Onset  . Heart Problems Mother   . Hypertension Father   . Healthy Daughter   . Healthy Son   . Healthy Son   . Healthy Son   . Healthy Daughter    Social History   Socioeconomic History  . Marital status: Single    Spouse name: None  . Number of children: 5  . Years of education: 3111  . Highest education level: 11th grade  Social Needs  . Financial resource strain: Not hard at all  . Food insecurity - worry: Never true  . Food insecurity - inability: Never true  . Transportation needs - medical: No  . Transportation needs - non-medical: No  Occupational History  . Occupation: disabled  Tobacco Use  . Smoking status: Current Every Day Smoker    Packs/day: 0.25    Years: 25.00    Pack years: 6.25    Types: Cigarettes  . Smokeless tobacco: Never Used  Substance and Sexual Activity  . Alcohol use: Yes    Comment: occasional  . Drug use: No  .  Sexual activity: No  Other Topics Concern  . None  Social History Narrative   Lives in an apartment and her son lives with her. She has never been married but is seeing someone. She has 5 adult children and 6 grandkids.     Tobacco Counseling Smokes about 4 cigarettes a day. She is working on quitting.   Clinical Intake:    Pain : 0-10 Pain Score: 5  Pain Type: Chronic pain Pain Location: Shoulder Pain Orientation: Right Pain Descriptors / Indicators: Aching Pain Onset: More than a month ago Pain Frequency: Intermittent Pain Relieving Factors: better after range of motion movements Effect of Pain on Daily Activities: Minimal. Does not affect daily  activities  Pain Relieving Factors: better after range of motion movements  BMI - recorded: 42.29 Nutritional Status: BMI > 30  Obese Diabetes: No  How often do you need to have someone help you when you read instructions, pamphlets, or other written materials from your doctor or pharmacy?: 2 - Rarely What is the last grade level you completed in school?: 11th  Interpreter Needed?: No  Information entered by :: Demetrios Loll, RN   Activities of Daily Living In your present state of health, do you have any difficulty performing the following activities: 11/14/2017 04/12/2017  Hearing? N N  Vision? N N  Comment Eye exam is overdue but she can see ok -  Difficulty concentrating or making decisions? N N  Walking or climbing stairs? N Y  Dressing or bathing? N N  Doing errands, shopping? N Y  Quarry manager and eating ? N -  Using the Toilet? N -  In the past six months, have you accidently leaked urine? Y -  Comment Has some minor urge incontinence -  Do you have problems with loss of bowel control? N -  Managing your Medications? N -  Managing your Finances? N -  Housekeeping or managing your Housekeeping? N -  Some recent data might be hidden     Immunizations and Health Maintenance Immunization History  Administered Date(s) Administered  . Influenza-Unspecified 07/29/2017  . Pneumococcal Polysaccharide-23 04/13/2017   Health Maintenance Due  Topic Date Due  . Hepatitis C Screening  1956-04-18  . TETANUS/TDAP  07/08/1976  . PAP SMEAR  07/08/1978  . COLONOSCOPY  07/09/2007    Patient Care Team: Elenora Gamma, MD as PCP - General (Family Medicine)  No hospitalizations, ER visits, or surgeries this past year.      Assessment:   This is a routine wellness examination for Sydney Cruz.  Hearing/Vision screen NO hearing or vision deficits noted during visit. Patient is overdue for eye exam.   Dietary issues and exercise activities discussed: Current Exercise Habits:  The patient does not participate in regular exercise at present, Exercise limited by: respiratory conditions(s)  Goals Increase activity level. Aim for 150 min of moderate activity a week.   Depression Screen PHQ 2/9 Scores 11/14/2017 11/14/2017 08/01/2017 04/25/2017  PHQ - 2 Score 0 0 0 3  PHQ- 9 Score - - - 7    Fall Risk Fall Risk  11/14/2017 08/01/2017 04/25/2017  Falls in the past year? No No No    Is the patient's home free of loose throw rugs in walkways, pet beds, electrical cords, etc?   yes      Grab bars in the bathroom? No. Has a shower chair in her tub.       Handrails on the stairs?   yes  Adequate lighting?   yes    Cognitive Function: MMSE - Mini Mental State Exam 11/14/2017  Orientation to time 5  Orientation to Place 5  Registration 3  Attention/ Calculation 5  Recall 3  Language- name 2 objects 2  Language- repeat 1  Language- follow 3 step command 3  Language- read & follow direction 1  Write a sentence 1  Copy design 0  Total score 29        Screening Tests Health Maintenance  Topic Date Due  . Hepatitis C Screening  [birth year]  . TETANUS/TDAP  07/08/1976  . PAP SMEAR  07/08/1978  . COLONOSCOPY  07/09/2007  . MAMMOGRAM  09/12/2018  . INFLUENZA VACCINE  Completed  . HIV Screening  Completed     Plan:    Increase activity level. Aim for 150 min of moderate activity a week. Keep inhaler with you Keep f/u with PCP Move carefully to avoid falls Cologuard Ordered and instructions reviewed  I have personally reviewed and noted the following in the patient's chart:   . Medical and social history . Use of alcohol, tobacco or illicit drugs  . Current medications and supplements . Functional ability and status . Nutritional status . Physical activity . Advanced directives . List of other physicians . Hospitalizations, surgeries, and ER visits in previous 12 months . Vitals . Screenings to include cognitive, depression, and falls . Referrals  and appointments  In addition, I have reviewed and discussed with patient certain preventive protocols, quality metrics, and best practice recommendations. A written personalized care plan for preventive services as well as general preventive health recommendations were provided to patient.     Demetrios Loll, RN   11/15/2017    I have reviewed and agree with the above AWV documentation.   Murtis Sink, MD [facility] 11/27/2017, 3:03 PM

## 2017-11-17 DIAGNOSIS — M25611 Stiffness of right shoulder, not elsewhere classified: Secondary | ICD-10-CM | POA: Diagnosis not present

## 2017-11-17 DIAGNOSIS — G8929 Other chronic pain: Secondary | ICD-10-CM | POA: Diagnosis not present

## 2017-11-17 DIAGNOSIS — Z96611 Presence of right artificial shoulder joint: Secondary | ICD-10-CM | POA: Diagnosis not present

## 2017-11-17 DIAGNOSIS — S42251D Displaced fracture of greater tuberosity of right humerus, subsequent encounter for fracture with routine healing: Secondary | ICD-10-CM | POA: Diagnosis not present

## 2017-11-17 DIAGNOSIS — Z471 Aftercare following joint replacement surgery: Secondary | ICD-10-CM | POA: Diagnosis not present

## 2017-11-21 ENCOUNTER — Ambulatory Visit (INDEPENDENT_AMBULATORY_CARE_PROVIDER_SITE_OTHER): Payer: Medicare Other | Admitting: Family Medicine

## 2017-11-21 ENCOUNTER — Encounter: Payer: Self-pay | Admitting: Family Medicine

## 2017-11-21 VITALS — BP 131/59 | HR 124 | Temp 100.7°F | Ht 66.0 in | Wt 257.0 lb

## 2017-11-21 DIAGNOSIS — N3001 Acute cystitis with hematuria: Secondary | ICD-10-CM | POA: Diagnosis not present

## 2017-11-21 DIAGNOSIS — R300 Dysuria: Secondary | ICD-10-CM | POA: Diagnosis not present

## 2017-11-21 LAB — CBC WITH DIFFERENTIAL/PLATELET
Basophils Absolute: 0.1 10*3/uL (ref 0.0–0.2)
Basos: 0 %
EOS (ABSOLUTE): 0 10*3/uL (ref 0.0–0.4)
EOS: 0 %
HEMATOCRIT: 37 % (ref 34.0–46.6)
HEMOGLOBIN: 11.9 g/dL (ref 11.1–15.9)
IMMATURE GRANULOCYTES: 0 %
Immature Grans (Abs): 0 10*3/uL (ref 0.0–0.1)
Lymphocytes Absolute: 1.8 10*3/uL (ref 0.7–3.1)
Lymphs: 12 %
MCH: 29.4 pg (ref 26.6–33.0)
MCHC: 32.2 g/dL (ref 31.5–35.7)
MCV: 91 fL (ref 79–97)
MONOCYTES: 9 %
Monocytes Absolute: 1.3 10*3/uL — ABNORMAL HIGH (ref 0.1–0.9)
NEUTROS PCT: 79 %
Neutrophils Absolute: 11.2 10*3/uL — ABNORMAL HIGH (ref 1.4–7.0)
Platelets: 262 10*3/uL (ref 150–379)
RBC: 4.05 x10E6/uL (ref 3.77–5.28)
RDW: 14.3 % (ref 12.3–15.4)
WBC: 14.4 10*3/uL — AB (ref 3.4–10.8)

## 2017-11-21 LAB — BASIC METABOLIC PANEL
BUN / CREAT RATIO: 9 — AB (ref 12–28)
BUN: 13 mg/dL (ref 8–27)
CALCIUM: 9.5 mg/dL (ref 8.7–10.3)
CO2: 24 mmol/L (ref 20–29)
CREATININE: 1.37 mg/dL — AB (ref 0.57–1.00)
Chloride: 93 mmol/L — ABNORMAL LOW (ref 96–106)
GFR calc Af Amer: 48 mL/min/{1.73_m2} — ABNORMAL LOW (ref >59)
GFR calc non Af Amer: 42 mL/min/{1.73_m2} — ABNORMAL LOW (ref >59)
GLUCOSE: 120 mg/dL — AB (ref 65–99)
Potassium: 4 mmol/L (ref 3.5–5.2)
Sodium: 133 mmol/L — ABNORMAL LOW (ref 134–144)

## 2017-11-21 LAB — URINALYSIS, COMPLETE
Bilirubin, UA: NEGATIVE
Glucose, UA: NEGATIVE
Nitrite, UA: NEGATIVE
PH UA: 6 (ref 5.0–7.5)
Specific Gravity, UA: 1.015 (ref 1.005–1.030)
UUROB: 0.2 mg/dL (ref 0.2–1.0)

## 2017-11-21 LAB — MICROSCOPIC EXAMINATION
RENAL EPITHEL UA: NONE SEEN /HPF
WBC, UA: >30 /hpf — AB (ref 0–?)

## 2017-11-21 MED ORDER — CEPHALEXIN 500 MG PO CAPS
500.0000 mg | ORAL_CAPSULE | Freq: Four times a day (QID) | ORAL | 0 refills | Status: AC
Start: 2017-11-21 — End: 2017-12-01

## 2017-11-21 MED ORDER — PHENAZOPYRIDINE HCL 100 MG PO TABS: 100.0000 mg | ORAL_TABLET | Freq: Three times a day (TID) | ORAL | 0 refills | Status: AC | PRN

## 2017-11-21 NOTE — Progress Notes (Signed)
Subjective: CC: UTI? PCP: Elenora Gamma, MD ZOX:[id number] Dearinger is a 62 y.o. female presenting to clinic today for:  1. Urinary symptoms Patient reports a 2-3-day history of dysuria and urinary frequency.  She reports associated subjective fevers and chills.  Denies hematuria, abdominal pain, nausea, vomiting, new back pain, vaginal discharge.  Patient has used her Tylenol 3 for symptoms.  Last use 2pm yesterday.  Patient denies a h/o frequent or recurrent UTIs.    ROS: Per HPI  Allergies  Allergen Reactions  . Ranitidine Hives   Past Medical History:  Diagnosis Date  . Arthritis   . Chronic back pain   . Chronic dislocation of right shoulder    for upcoming operative repair  . COPD (chronic obstructive pulmonary disease) (HCC)    has not been formally told she has this  . Hypertension     Current Outpatient Medications:  .  albuterol (PROVENTIL HFA;VENTOLIN HFA) 108 (90 Base) MCG/ACT inhaler, Inhale into the lungs., Disp: , Rfl:  .  amLODipine (NORVASC) 10 MG tablet, Take 10 mg by mouth daily. , Disp: , Rfl:  .  buPROPion (WELLBUTRIN SR) 150 MG 12 hr tablet, Take 1 tablet (150 mg total) by mouth 2 (two) times daily., Disp: 60 tablet, Rfl: 2 .  lisinopril-hydrochlorothiazide (PRINZIDE,ZESTORETIC) 20-25 MG tablet, Take 1 tablet by mouth daily., Disp: 30 tablet, Rfl: 4 .  naproxen (NAPROSYN) 500 MG tablet, Take 1 tablet (500 mg total) by mouth 2 (two) times daily with a meal., Disp: 60 tablet, Rfl: 0 .  acetaminophen-codeine (TYLENOL #3) 300-30 MG tablet, Take 1 tablet by mouth every 8 (eight) hours as needed for moderate pain. (Patient not taking: Reported on 11/14/2017), Disp: 30 tablet, Rfl: 0 Social History   Socioeconomic History  . Marital status: Single    Spouse name: Not on file  . Number of children: 5  . Years of education: 6  . Highest education level: 11th grade  Social Needs  . Financial resource strain: Not hard at all  . Food insecurity - worry: Never true    . Food insecurity - inability: Never true  . Transportation needs - medical: No  . Transportation needs - non-medical: No  Occupational History  . Occupation: disabled  Tobacco Use  . Smoking status: Current Every Day Smoker    Packs/day: 0.25    Years: 25.00    Pack years: 6.25    Types: Cigarettes  . Smokeless tobacco: Never Used  Substance and Sexual Activity  . Alcohol use: Yes    Comment: occasional  . Drug use: No  . Sexual activity: No  Other Topics Concern  . Not on file  Social History Narrative   Lives in an apartment and her son lives with her. She has never been married but is seeing someone. She has 5 adult children and 6 grandkids.    Family History  Problem Relation Age of Onset  . Heart Problems Mother   . Hypertension Father   . Healthy Daughter   . Healthy Son   . Healthy Son   . Healthy Son   . Healthy Daughter     Objective: Office vital signs reviewed. BP (!) 131/59   Pulse (!) 124   Temp (!) 100.7 F (38.2 C) (Oral)   Ht 5\' 6"  (1.676 m)   Wt 257 lb (116.6 kg)   BMI 41.48 kg/m   Physical Examination:  General: Awake, alert, obese, nontoxic, No acute distress Cardio: sinus tachycardia, S1S2 heard,  no murmurs appreciated Pulm: no wheeze; normal work of breathing on room air GU: + suprapubic TTP. No CVA TTP.  Assessment/ Plan: 62 y.o. female   1. Acute cystitis with hematuria Patient is febrile and tachycardic here in office.  Her physical exam was remarkable for mild suprapubic tenderness to palpation.  She is nontoxic appearing.  Urinalysis with 3+ blood, 2+ protein and 3+ leukocytes.  Awaiting urine microscopy.  Urine has been sent for culture.  Given tachycardia and fever here in office, will treat empirically for pyelonephritis with Keflex 500 mg p.o. 4 times daily times 10 days.  Check CBC with differential and BMP.  Will contact patient with results of culture once they are available.  Home care instructions were reviewed with the  patient, who voiced good understanding. - Urinalysis, Complete - Basic Metabolic Panel - CBC with Differential - Urine Culture  Strict return precautions and reasons for emergent evaluation in the emergency department review with patient.  She voiced understanding and will follow-up as needed.  Orders Placed This Encounter  Procedures  . Urine Culture  . Urinalysis, Complete  . Basic Metabolic Panel  . CBC with Differential   Meds ordered this encounter  Medications  . cephALEXin (KEFLEX) 500 MG capsule    Sig: Take 1 capsule (500 mg total) by mouth 4 (four) times daily for 10 days.    Dispense:  40 capsule    Refill:  0  . phenazopyridine (PYRIDIUM) 100 MG tablet    Sig: Take 1 tablet (100 mg total) by mouth 3 (three) times daily as needed for pain.    Dispense:  6 tablet    Refill:  0    Moustafa Mossa Hulen SkainsM Addysyn Fern, DO [facility] [phone]

## 2017-11-21 NOTE — Patient Instructions (Addendum)
You had labs performed today.  You will be contacted with the results of the labs once they are available, usually in the next 3 days for routine lab work.   Urinary Tract Infection, Adult A urinary tract infection (UTI) is an infection of any part of the urinary tract. The urinary tract includes the:  Kidneys.  Ureters.  Bladder.  Urethra.  These organs make, store, and get rid of pee (urine) in the body. Follow these instructions at home:  Take over-the-counter and prescription medicines only as told by your doctor.  If you were prescribed an antibiotic medicine, take it as told by your doctor. Do not stop taking the antibiotic even if you start to feel better.  Avoid the following drinks: ? Alcohol. ? Caffeine. ? Tea. ? Carbonated drinks.  Drink enough fluid to keep your pee clear or pale yellow.  Keep all follow-up visits as told by your doctor. This is important.  Make sure to: ? Empty your bladder often and completely. Do not to hold pee for long periods of time. ? Empty your bladder before and after sex. ? Wipe from front to back after a bowel movement if you are female. Use each tissue one time when you wipe. Contact a doctor if:  You have back pain.  You have a fever.  You feel sick to your stomach (nauseous).  You throw up (vomit).  Your symptoms do not get better after 3 days.  Your symptoms go away and then come back. Get help right away if:  You have very bad back pain.  You have very bad lower belly (abdominal) pain.  You are throwing up and cannot keep down any medicines or water. This information is not intended to replace advice given to you by your health care provider. Make sure you discuss any questions you have with your health care provider. Document Released: 04/11/2008 Document Revised: 03/31/2016 Document Reviewed: 09/14/2015 Elsevier Interactive Patient Education  2018 [employer]   I value your feedback and appreciate you  entrusting us with your care.  If you get a survey, I would appreciate your taking the time to let us know what your experience was like.

## 2017-11-22 ENCOUNTER — Telehealth: Payer: Self-pay | Admitting: Family Medicine

## 2017-11-22 NOTE — Telephone Encounter (Signed)
I spoke to patient.  She has had 4 episodes of nonbloody diarrhea in the last 24 hours.  She reports good hydration.  She notes that urinary symptoms are improving on antibiotic.  Denies nausea or vomiting.  Fevers have resolved.  We discussed that she should start a probiotic or start eating yogurt in efforts to stabilize GI bacteria.  If symptoms are persistent, worsen, she is unable to stay hydrated, she develops blood in stool, I did advise her to call our office back and this would prompt changing of medication.  She will need treatment/coverage for pyelonephritis given recent labs.  I am still waiting her urine culture.  For now, continue Keflex 500 mg 4 times daily.

## 2017-11-23 LAB — URINE CULTURE

## 2017-11-27 ENCOUNTER — Other Ambulatory Visit: Payer: Self-pay | Admitting: Family Medicine

## 2017-11-27 NOTE — Telephone Encounter (Signed)
Last seen 11/21/17  Dr Reece AgarG  Dr Ermalinda MemosBradshaw PCP

## 2017-11-28 DIAGNOSIS — Z96611 Presence of right artificial shoulder joint: Secondary | ICD-10-CM | POA: Diagnosis not present

## 2017-11-28 DIAGNOSIS — S42251D Displaced fracture of greater tuberosity of right humerus, subsequent encounter for fracture with routine healing: Secondary | ICD-10-CM | POA: Diagnosis not present

## 2017-11-28 DIAGNOSIS — G8929 Other chronic pain: Secondary | ICD-10-CM | POA: Diagnosis not present

## 2017-11-28 DIAGNOSIS — Z471 Aftercare following joint replacement surgery: Secondary | ICD-10-CM | POA: Diagnosis not present

## 2017-11-28 DIAGNOSIS — M25611 Stiffness of right shoulder, not elsewhere classified: Secondary | ICD-10-CM | POA: Diagnosis not present

## 2017-11-30 ENCOUNTER — Ambulatory Visit: Payer: Medicare Other | Admitting: Family Medicine

## 2017-11-30 DIAGNOSIS — Z471 Aftercare following joint replacement surgery: Secondary | ICD-10-CM | POA: Diagnosis not present

## 2017-11-30 DIAGNOSIS — S42251D Displaced fracture of greater tuberosity of right humerus, subsequent encounter for fracture with routine healing: Secondary | ICD-10-CM | POA: Diagnosis not present

## 2017-11-30 DIAGNOSIS — G8929 Other chronic pain: Secondary | ICD-10-CM | POA: Diagnosis not present

## 2017-11-30 DIAGNOSIS — M25611 Stiffness of right shoulder, not elsewhere classified: Secondary | ICD-10-CM | POA: Diagnosis not present

## 2017-11-30 DIAGNOSIS — Z96611 Presence of right artificial shoulder joint: Secondary | ICD-10-CM | POA: Diagnosis not present

## 2017-12-04 ENCOUNTER — Ambulatory Visit: Payer: Medicare Other | Admitting: Family Medicine

## 2017-12-05 DIAGNOSIS — Z471 Aftercare following joint replacement surgery: Secondary | ICD-10-CM | POA: Diagnosis not present

## 2017-12-05 DIAGNOSIS — Z96611 Presence of right artificial shoulder joint: Secondary | ICD-10-CM | POA: Diagnosis not present

## 2017-12-05 DIAGNOSIS — M25611 Stiffness of right shoulder, not elsewhere classified: Secondary | ICD-10-CM | POA: Diagnosis not present

## 2017-12-05 DIAGNOSIS — G8929 Other chronic pain: Secondary | ICD-10-CM | POA: Diagnosis not present

## 2017-12-05 DIAGNOSIS — S42251D Displaced fracture of greater tuberosity of right humerus, subsequent encounter for fracture with routine healing: Secondary | ICD-10-CM | POA: Diagnosis not present

## 2017-12-08 DIAGNOSIS — Z471 Aftercare following joint replacement surgery: Secondary | ICD-10-CM | POA: Diagnosis not present

## 2017-12-08 DIAGNOSIS — M25611 Stiffness of right shoulder, not elsewhere classified: Secondary | ICD-10-CM | POA: Diagnosis not present

## 2017-12-08 DIAGNOSIS — M25511 Pain in right shoulder: Secondary | ICD-10-CM | POA: Diagnosis not present

## 2017-12-08 DIAGNOSIS — Z96611 Presence of right artificial shoulder joint: Secondary | ICD-10-CM | POA: Diagnosis not present

## 2017-12-12 ENCOUNTER — Encounter: Payer: Self-pay | Admitting: Family Medicine

## 2017-12-12 ENCOUNTER — Ambulatory Visit (INDEPENDENT_AMBULATORY_CARE_PROVIDER_SITE_OTHER): Payer: Medicare Other | Admitting: Family Medicine

## 2017-12-12 ENCOUNTER — Ambulatory Visit (INDEPENDENT_AMBULATORY_CARE_PROVIDER_SITE_OTHER): Payer: Medicare Other

## 2017-12-12 VITALS — BP 151/90 | HR 111 | Temp 97.0°F | Ht 66.0 in | Wt 256.2 lb

## 2017-12-12 DIAGNOSIS — N3000 Acute cystitis without hematuria: Secondary | ICD-10-CM | POA: Diagnosis not present

## 2017-12-12 DIAGNOSIS — Z79891 Long term (current) use of opiate analgesic: Secondary | ICD-10-CM | POA: Diagnosis not present

## 2017-12-12 DIAGNOSIS — Z96611 Presence of right artificial shoulder joint: Secondary | ICD-10-CM | POA: Diagnosis not present

## 2017-12-12 DIAGNOSIS — Z471 Aftercare following joint replacement surgery: Secondary | ICD-10-CM | POA: Diagnosis not present

## 2017-12-12 DIAGNOSIS — I10 Essential (primary) hypertension: Secondary | ICD-10-CM | POA: Diagnosis not present

## 2017-12-12 DIAGNOSIS — M51369 Other intervertebral disc degeneration, lumbar region without mention of lumbar back pain or lower extremity pain: Secondary | ICD-10-CM

## 2017-12-12 DIAGNOSIS — M545 Low back pain: Secondary | ICD-10-CM | POA: Diagnosis not present

## 2017-12-12 DIAGNOSIS — M5136 Other intervertebral disc degeneration, lumbar region: Secondary | ICD-10-CM | POA: Diagnosis not present

## 2017-12-12 DIAGNOSIS — M25611 Stiffness of right shoulder, not elsewhere classified: Secondary | ICD-10-CM | POA: Diagnosis not present

## 2017-12-12 DIAGNOSIS — M25511 Pain in right shoulder: Secondary | ICD-10-CM | POA: Diagnosis not present

## 2017-12-12 DIAGNOSIS — Z0289 Encounter for other administrative examinations: Secondary | ICD-10-CM

## 2017-12-12 MED ORDER — ACETAMINOPHEN-CODEINE #3 300-30 MG PO TABS: 1.0000 | ORAL_TABLET | Freq: Three times a day (TID) | ORAL | 1 refills | Status: AC | PRN

## 2017-12-12 MED ORDER — FLUCONAZOLE 150 MG PO TABS: ORAL_TABLET | ORAL | 0 refills | Status: AC

## 2017-12-12 MED ORDER — SULFAMETHOXAZOLE-TRIMETHOPRIM 800-160 MG PO TABS
1.0000 | ORAL_TABLET | Freq: Two times a day (BID) | ORAL | 0 refills | Status: AC
Start: 2017-12-12 — End: ?

## 2017-12-12 NOTE — Progress Notes (Signed)
   HPI  Patient presents today for follow-up chronic medical conditions.  Patient has chronic back pain, she uses intermittent Tylenol 3 for this. Patient states that her pain is between the lumbar and thoracic spine. She reports 4-5 vertebrae that have degenerative disc. Uses maybe 1 or 2 Tylenol 3/week for pain.  Hypertension Elevated today, reports good medication compliance.  Patient was recently seen for what was felt to be pyelonephritis, she had elevated WBC and urinalysis concerning for UTI. She states that she only took 3 doses of Keflex and stopped due to diarrhea, she denies any persistent dysuria but states she has vaginal and urethral itching and irritation. She denies fever, chills, sweats.  We reviewed pain management contract today   PMH: Smoking status noted ROS: Per HPI  Objective: BP (!) 151/90   Pulse (!) 111   Temp (!) 97 F (36.1 C) (Oral)   Ht '5\' 6"'$  (1.676 m)   Wt 256 lb 3.2 oz (116.2 kg)   BMI 41.35 kg/m  Gen: NAD, alert, cooperative with exam HEENT: NCAT CV: RRR, good S1/S2, no murmur Resp: CTABL, no wheezes, non-labored Abd: No CVA tenderness Ext: No edema, warm    Assessment and plan:  #Degenerative disc disease, pain management contract signed Films pending, per patient's report. Refill Tylenol with codeine She requests NSAIDs, however has had recent acute kidney injury so I will repeat labs before prescribing that.  Urine drug test today, [place] controlled substance database reviewed with no red flags   #Hypertension Elevated today Labs No changes Prinzide plus Norvasc  #Acute cystitis Patient with previous signs and symptoms of UTI with elevated WBC, culture was not that impressive, however she did have concerning symptoms. Given Bactrim times 7 days and repeat a culture     Orders Placed This Encounter  Procedures  . Urine Culture  . CMP14+EGFR  . CBC with Differential/Platelet  . LDL Cholesterol, Direct     Meds ordered this encounter  Medications  . sulfamethoxazole-trimethoprim (BACTRIM DS) 800-160 MG tablet    Sig: Take 1 tablet by mouth 2 (two) times daily.    Dispense:  14 tablet    Refill:  0  . fluconazole (DIFLUCAN) 150 MG tablet    Sig: Take one pill and repeat in 3 days    Dispense:  2 tablet    Refill:  0  . acetaminophen-codeine (TYLENOL #3) 300-30 MG tablet    Sig: Take 1 tablet by mouth every 8 (eight) hours as needed for moderate pain.    Dispense:  30 tablet    Refill:  1    Chronic pain med    Laroy Apple, MD [facility] 12/12/2017, 3:05 PM

## 2017-12-12 NOTE — Progress Notes (Signed)
tox 

## 2017-12-12 NOTE — Patient Instructions (Signed)
Great to see you!   

## 2017-12-13 LAB — URINE CULTURE

## 2017-12-14 ENCOUNTER — Other Ambulatory Visit: Payer: Self-pay | Admitting: Family Medicine

## 2017-12-14 NOTE — Telephone Encounter (Signed)
Pt aware, she will come by today for labs

## 2017-12-14 NOTE — Telephone Encounter (Signed)
Sorry I did not explain yesterday at our visit.   I wsa trying to wait until her labs returned to prescribe naproxen, this can be hard on your kidneys and she has had a recent kidney injury due to the UTI.   I would like to be sure that she is back to normal before using NSAIDs. It looks like her labs were not drawn, can she come back sometime to have them drawn?  Murtis SinkSam Della Homan, MD [facility] 12/14/2017, 10:27 AM

## 2017-12-15 DIAGNOSIS — Z471 Aftercare following joint replacement surgery: Secondary | ICD-10-CM | POA: Diagnosis not present

## 2017-12-15 DIAGNOSIS — M25611 Stiffness of right shoulder, not elsewhere classified: Secondary | ICD-10-CM | POA: Diagnosis not present

## 2017-12-15 DIAGNOSIS — M25511 Pain in right shoulder: Secondary | ICD-10-CM | POA: Diagnosis not present

## 2017-12-15 DIAGNOSIS — Z96611 Presence of right artificial shoulder joint: Secondary | ICD-10-CM | POA: Diagnosis not present

## 2017-12-15 LAB — TOXASSURE SELECT 13 (MW), URINE

## 2018-01-02 ENCOUNTER — Other Ambulatory Visit: Payer: Medicare Other | Admitting: Family Medicine

## 2018-02-06 IMAGING — DX DG LUMBAR SPINE 2-3V
2 series · 2 of 2 positions shown · non-contrast
Comparison: Thoracic spine films of 08/01/2017

CLINICAL DATA: Low back pain

EXAM:
LUMBAR SPINE - 2-3 VIEW

[l-spine ap]
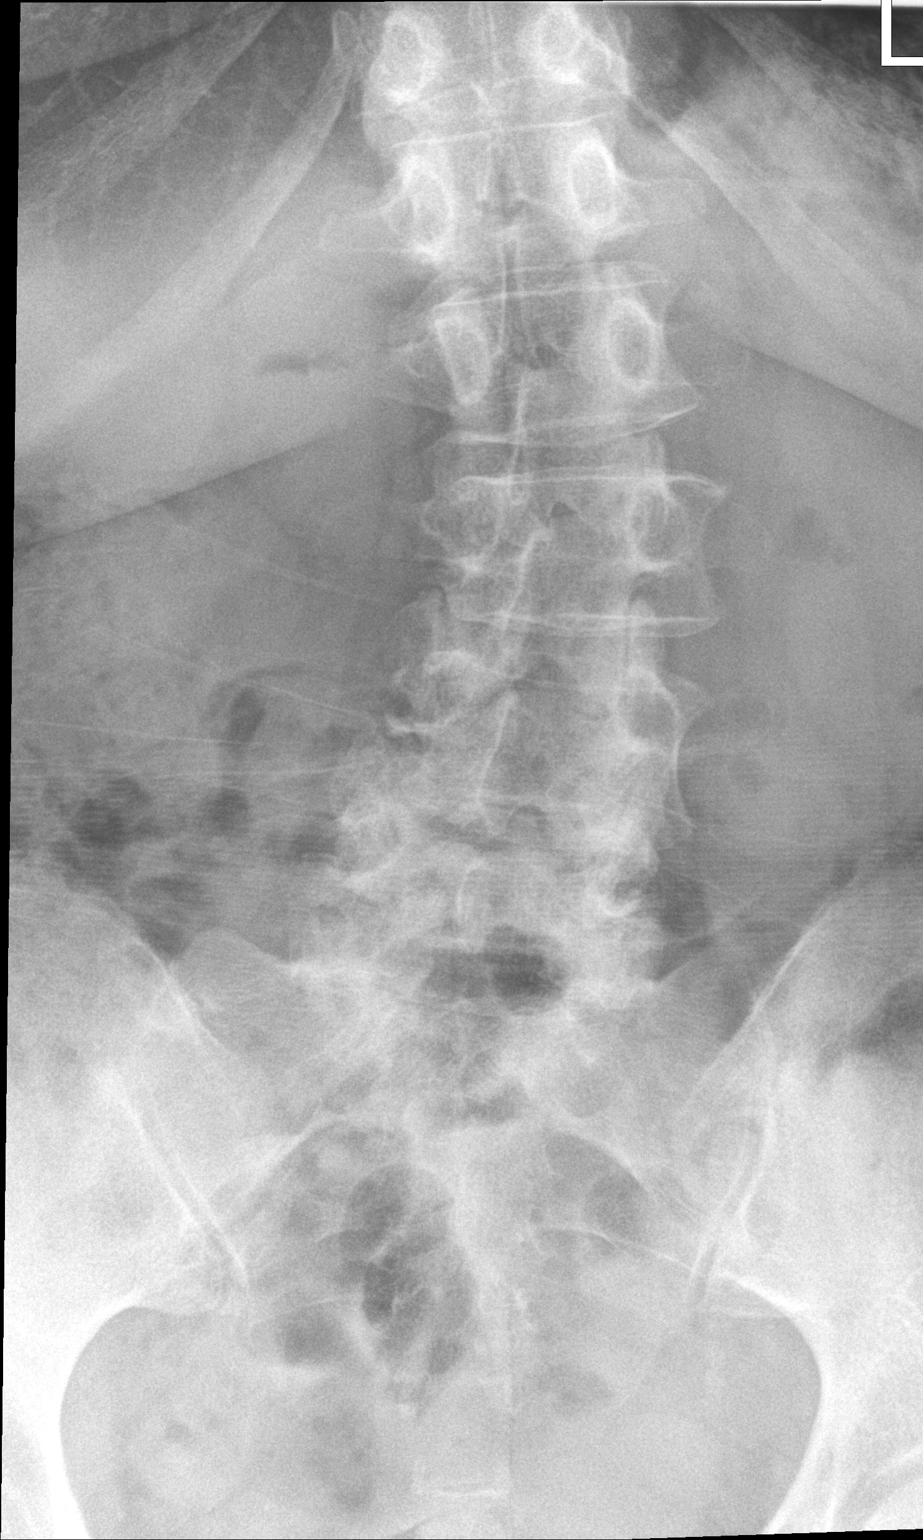

[l-spine lat]
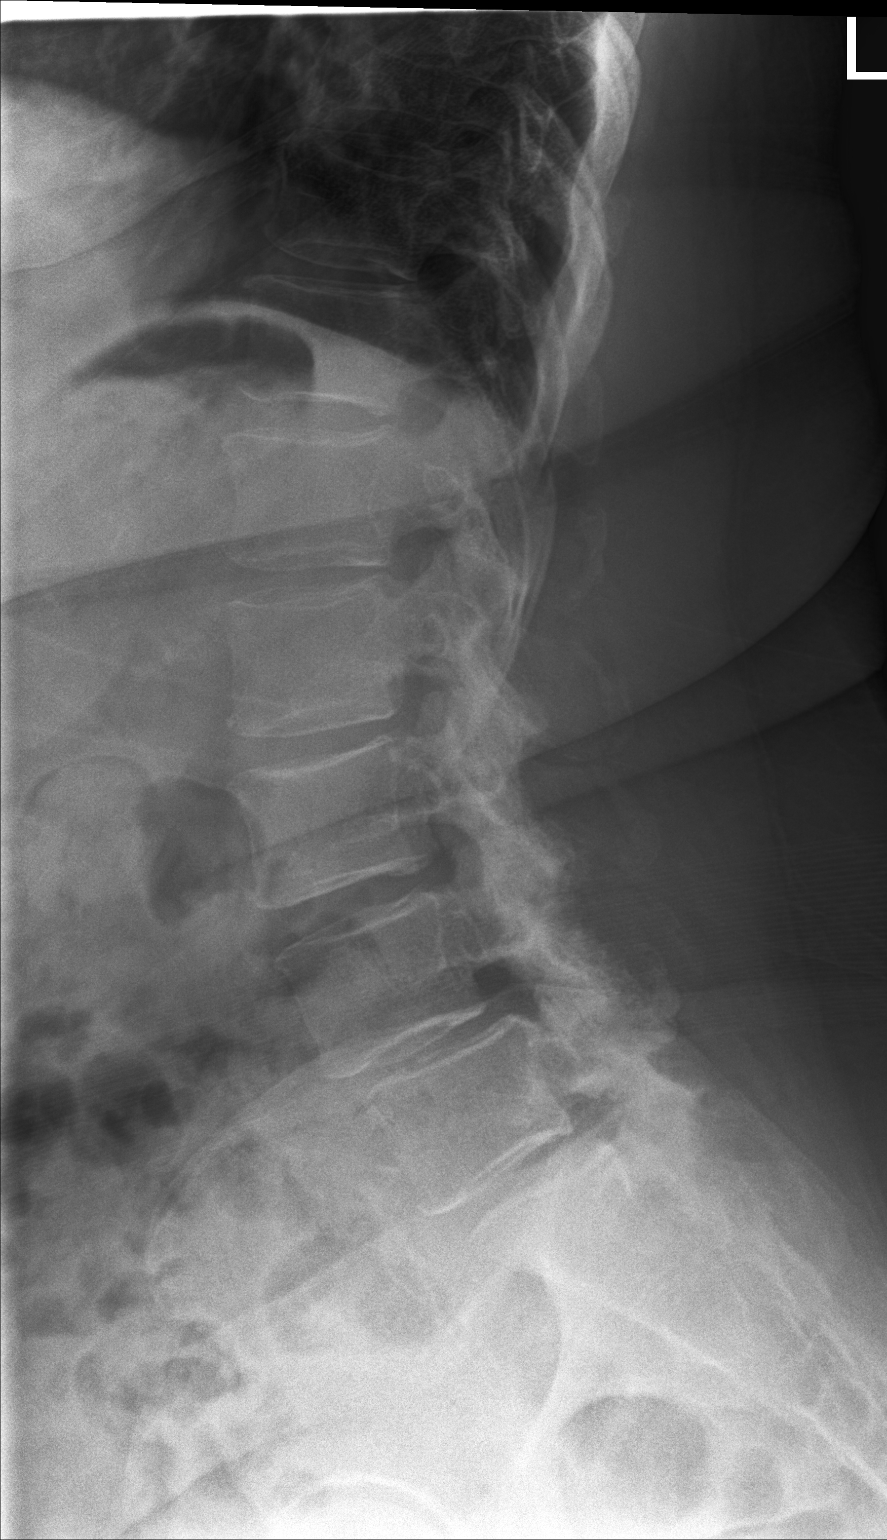

[2 of 2 positions shown; findings below may reference images not displayed]

FINDINGS: There is a lumbar scoliosis convex to the left by approximately 15
degrees. There is 5 mm anterolisthesis of L4 on L5 which appears
degenerative in origin. There is some degenerative disc disease with
loss of disc space at L4-5. No compression deformity is seen. The SI
joints appear corticated.
IMPRESSION: 1. Degenerative disc disease primarily at L4-5. No compression
deformity.
2. 5 mm anterolisthesis of L4 on L5 degenerative in origin.
3. Lumbar scoliosis convex to the left by 15 degrees.

## 2018-03-07 ENCOUNTER — Telehealth: Payer: Self-pay | Admitting: *Deleted

## 2018-03-07 DIAGNOSIS — R404 Transient alteration of awareness: Secondary | ICD-10-CM | POA: Diagnosis not present

## 2018-03-08 ENCOUNTER — Ambulatory Visit: Payer: Medicare Other | Admitting: Family Medicine

## 2018-03-09 NOTE — Telephone Encounter (Signed)
Incoming call form [facility] with [employer] 911 Pt found deceased  asking if Dr Ermalinda Memos will sign death certificate Dr Nadine Counts covering Dr Ermalinda Memos spoke with medical examiner Pt found in bed, no sign of foul play, no marks or medications found in area of pt Per medical examiner Almira Coaster, death occurred naturally Dr Nadine Counts agreed we will sign death certificate

## 2018-04-07 DIAGNOSIS — 419620001 Death: Secondary | SNOMED CT | POA: Diagnosis not present

## 2018-04-07 DEATH — deceased
